# Patient Record
Sex: Male | Born: 1955 | Race: White | Hispanic: No | Marital: Married | Smoking: Former smoker
Health system: Southern US, Community
[De-identification: ages and names within clinical notes are randomized; demographics above are authoritative.]

## PROBLEM LIST (undated history)

## (undated) DIAGNOSIS — Z86018 Personal history of other benign neoplasm: Secondary | ICD-10-CM

## (undated) DIAGNOSIS — I10 Essential (primary) hypertension: Secondary | ICD-10-CM

## (undated) DIAGNOSIS — E7800 Pure hypercholesterolemia, unspecified: Secondary | ICD-10-CM

## (undated) DIAGNOSIS — Z85828 Personal history of other malignant neoplasm of skin: Secondary | ICD-10-CM

## (undated) DIAGNOSIS — Z86006 Personal history of melanoma in-situ: Secondary | ICD-10-CM

## (undated) HISTORY — PX: COLONOSCOPY: SHX174

## (undated) HISTORY — DX: Personal history of other benign neoplasm: Z86.018

## (undated) HISTORY — DX: Personal history of melanoma in-situ: Z86.006

## (undated) HISTORY — DX: Personal history of other malignant neoplasm of skin: Z85.828

## (undated) HISTORY — PX: FRACTURE SURGERY: SHX138

---

## 2005-12-30 ENCOUNTER — Ambulatory Visit: Payer: Self-pay | Admitting: Gastroenterology

## 2006-04-18 ENCOUNTER — Ambulatory Visit: Payer: Self-pay | Admitting: Unknown Physician Specialty

## 2006-04-20 ENCOUNTER — Ambulatory Visit: Payer: Self-pay | Admitting: Unknown Physician Specialty

## 2011-05-05 ENCOUNTER — Ambulatory Visit: Payer: Self-pay | Admitting: Unknown Physician Specialty

## 2011-06-22 DIAGNOSIS — Z86006 Personal history of melanoma in-situ: Secondary | ICD-10-CM

## 2011-06-22 HISTORY — DX: Personal history of melanoma in-situ: Z86.006

## 2011-07-04 ENCOUNTER — Ambulatory Visit: Payer: Self-pay | Admitting: Internal Medicine

## 2011-07-04 IMAGING — CR DG CHEST 2V
1 series · 3 of 3 positions shown · non-contrast
Comparison: none

REASON FOR EXAM: melanoma on lower back
COMMENTS:

PROCEDURE:     DXR - DXR CHEST PA (OR AP) AND LATERAL  - [DATE]  [DATE]
RESULT:     Comparison: None.

[Series 1: w chest pa · 0.14mm/px · 3 of 3 slices shown]
[im 1/3]
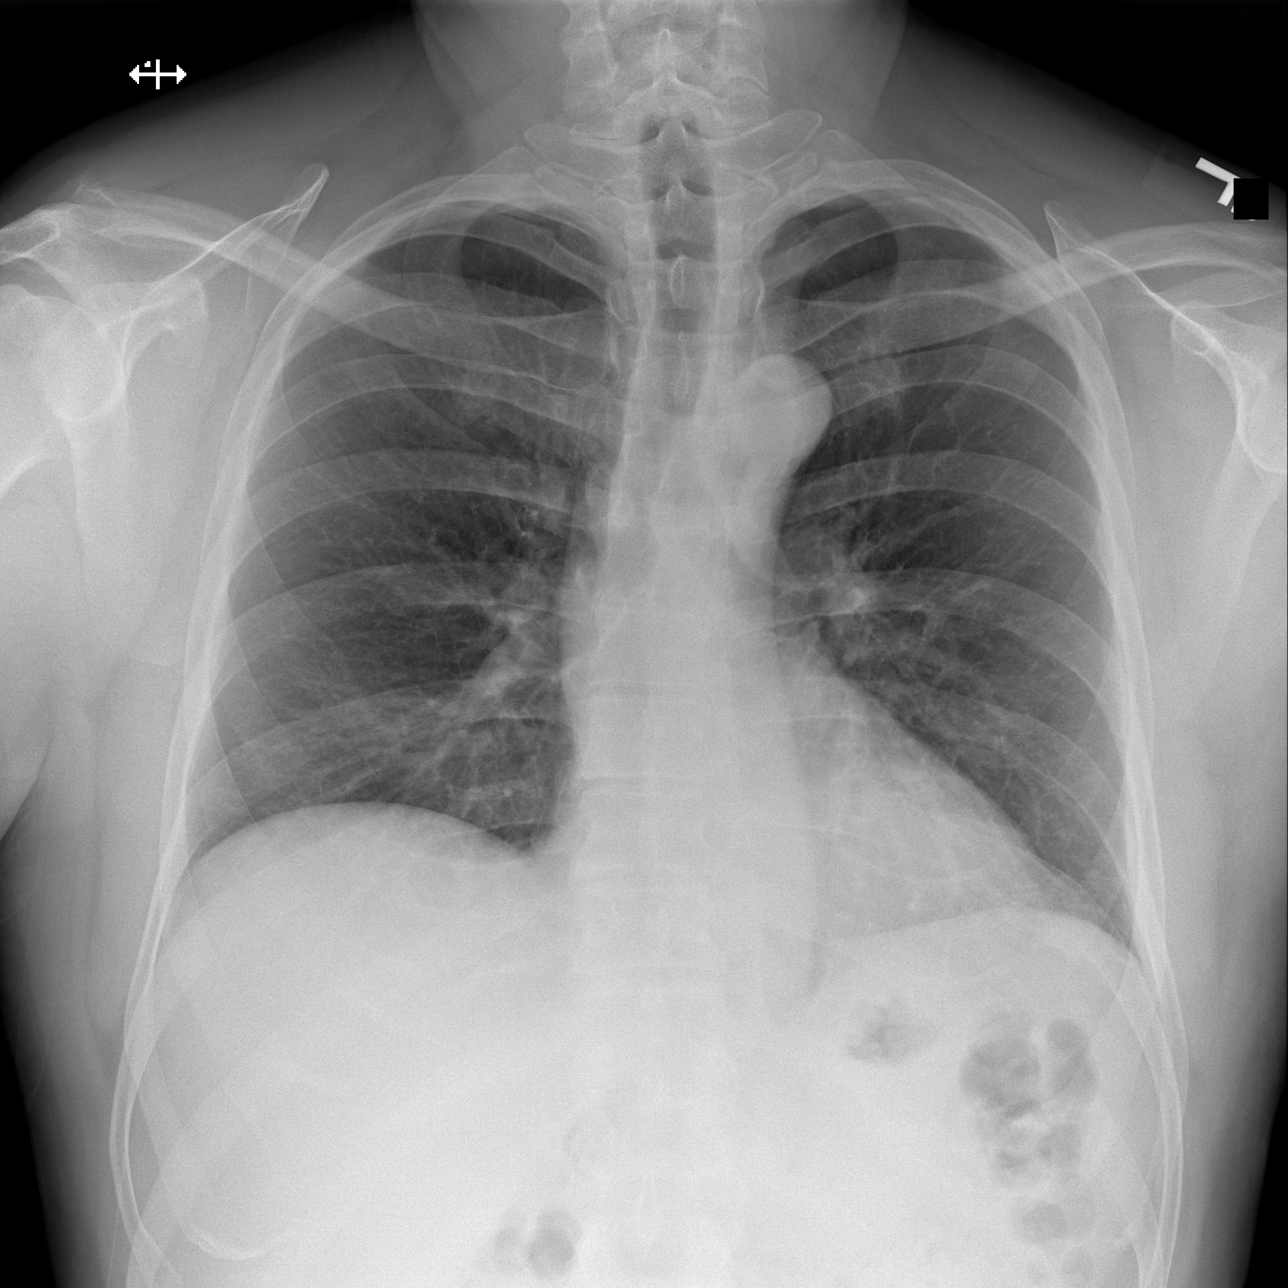
[im 2/3]
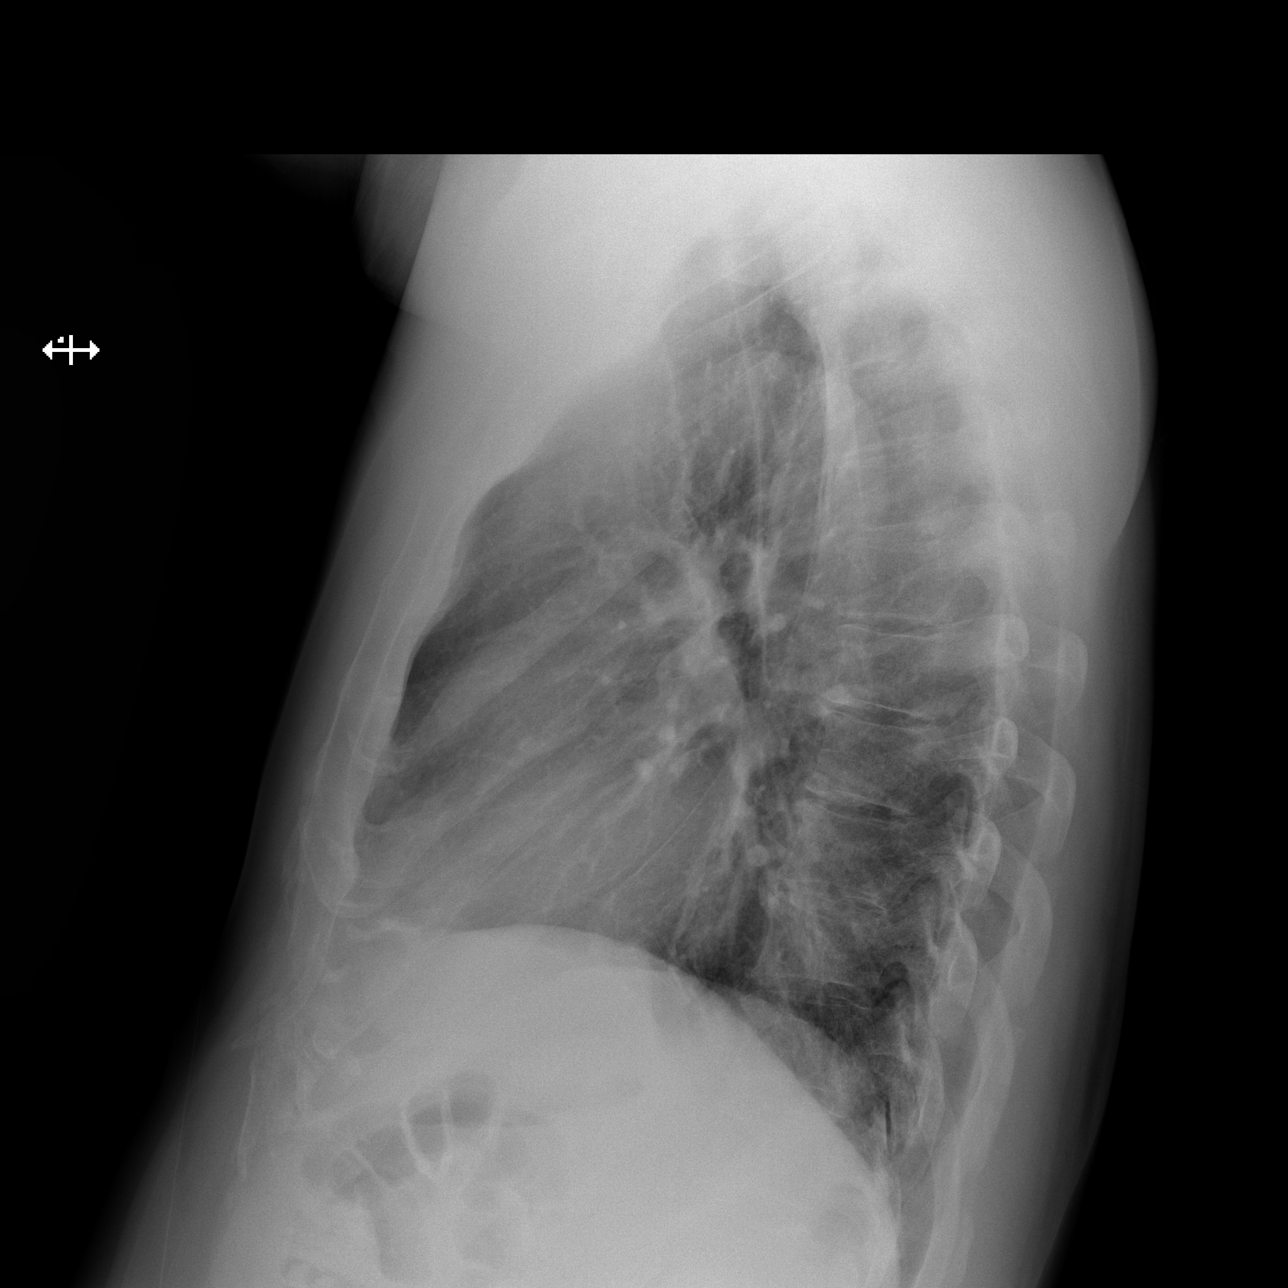
[im 3/3]
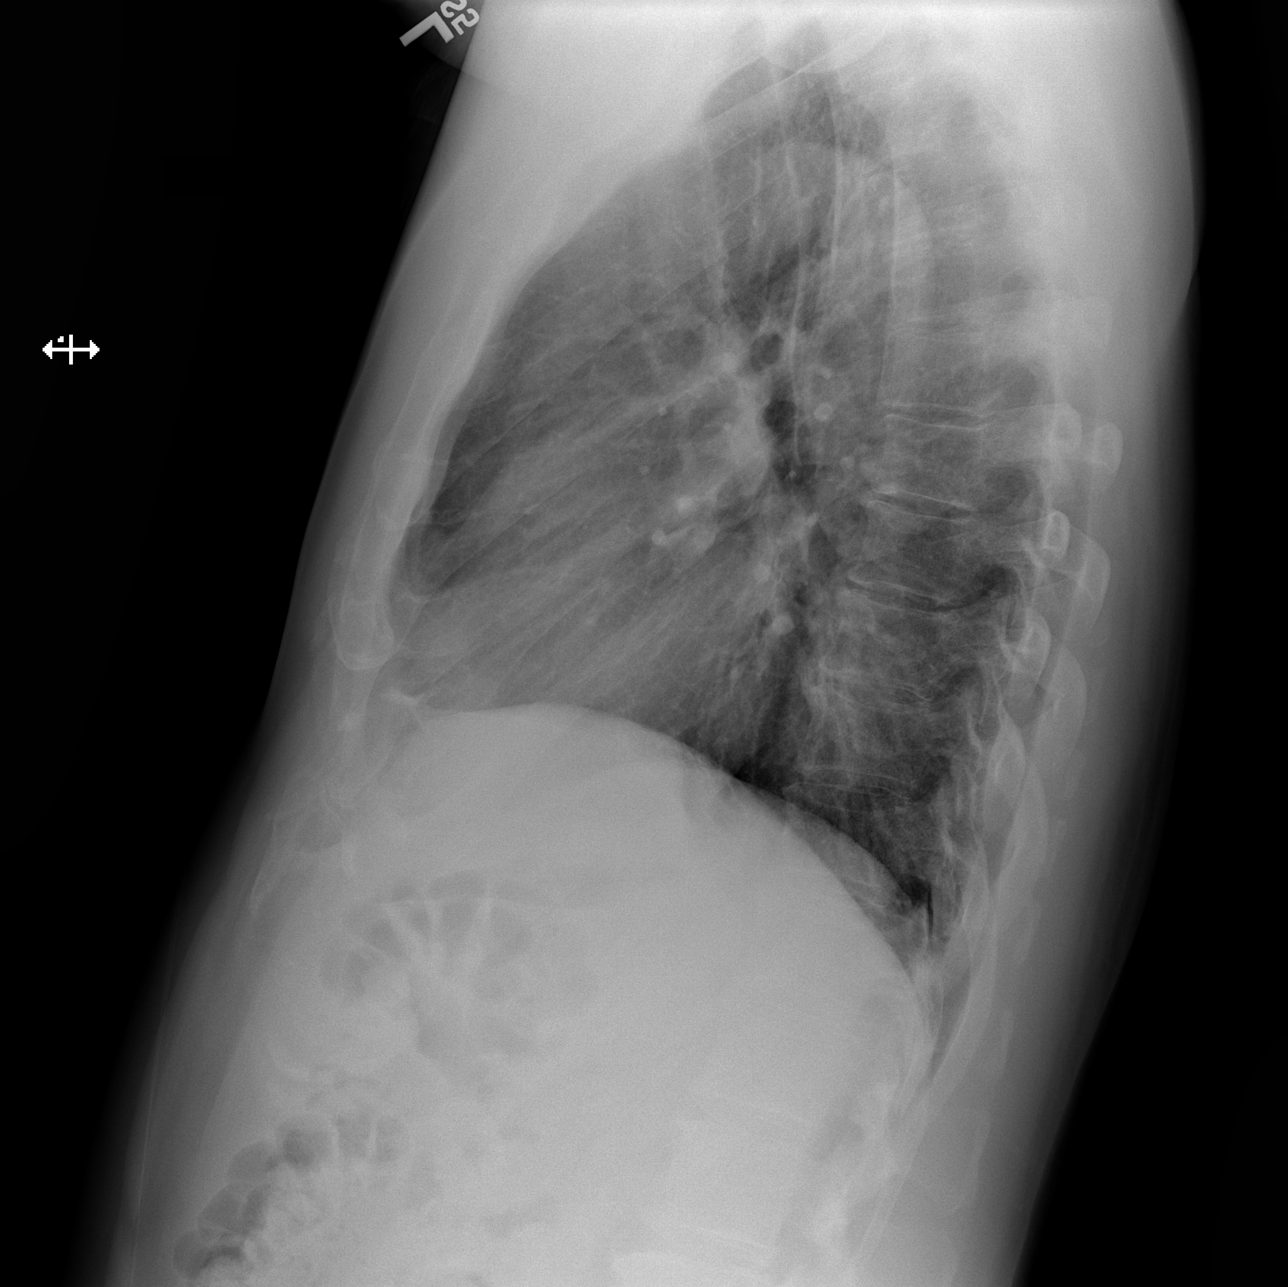

[3 of 3 positions shown; findings below may reference images not displayed]

FINDINGS: Heart is normal in size. Aorta mildly tortuous. No focal pulmonary opacities.
IMPRESSION: No acute cardiopulmonary disease.

## 2014-07-10 DIAGNOSIS — Z85828 Personal history of other malignant neoplasm of skin: Secondary | ICD-10-CM

## 2014-07-10 HISTORY — DX: Personal history of other malignant neoplasm of skin: Z85.828

## 2014-08-26 ENCOUNTER — Ambulatory Visit: Payer: Self-pay | Admitting: Family Medicine

## 2014-08-26 IMAGING — US US GALLBLADDER-BILIARY (RUQ)
1 series · 14 of 25 positions shown · non-contrast
Comparison: Ultrasound [DATE]  only report available.

CLINICAL DATA: Elevated liver enzymes

EXAM:
US ABDOMEN LIMITED - RIGHT UPPER QUADRANT

[Series 1: us gallbladder-biliary (ruq) · 0.26mm/px · 14 of 46 slices shown]
[im 1/46]
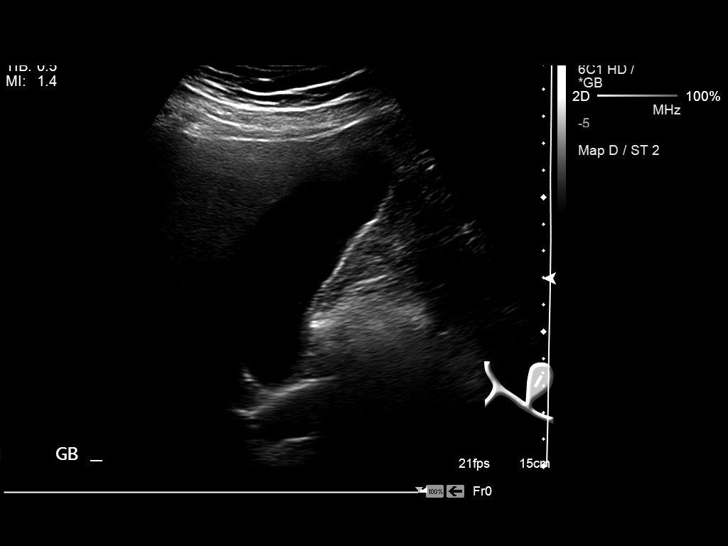
[im 4/46]
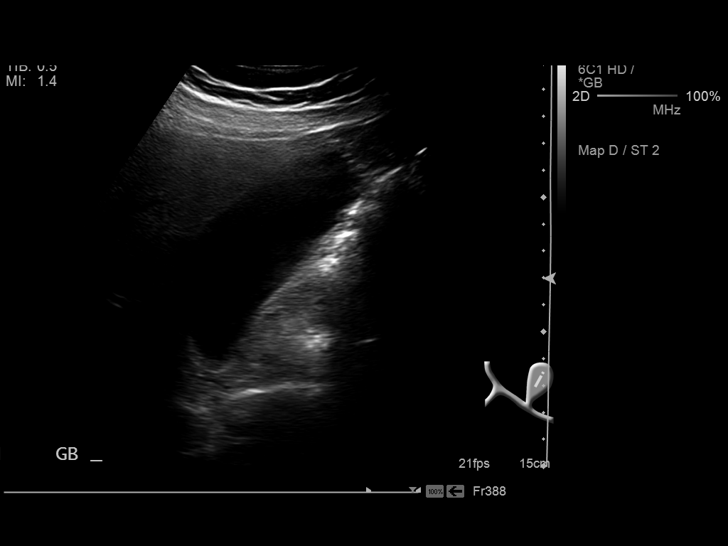
[im 8/46]
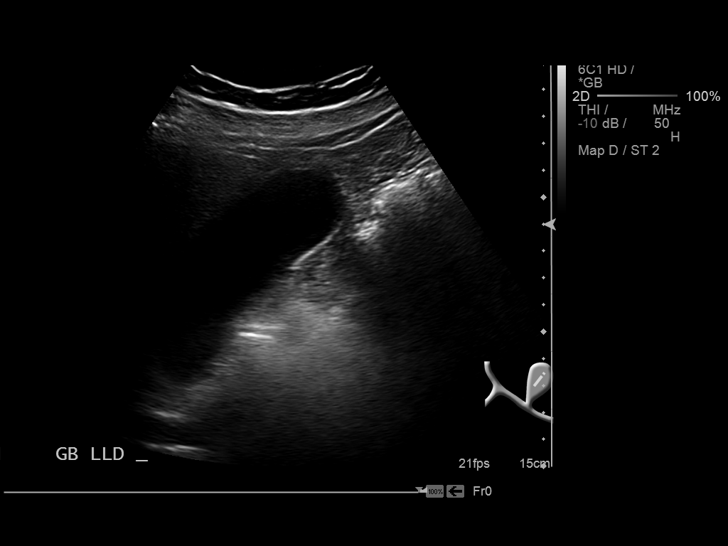
[im 12/46]
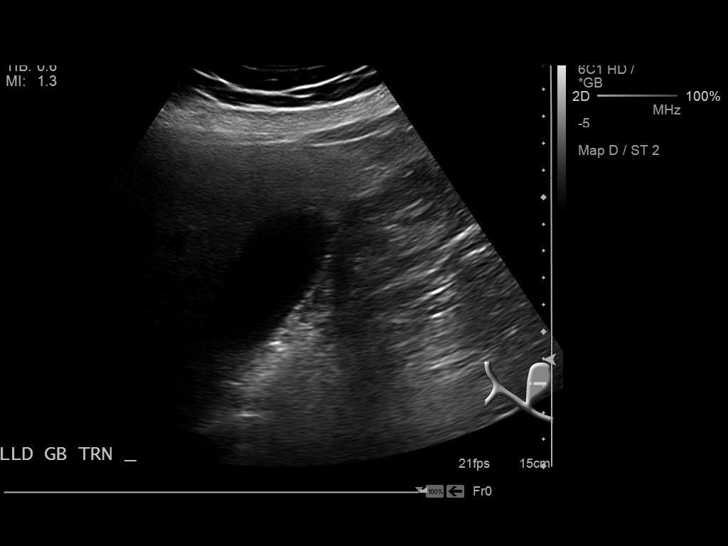
[im 16/46]
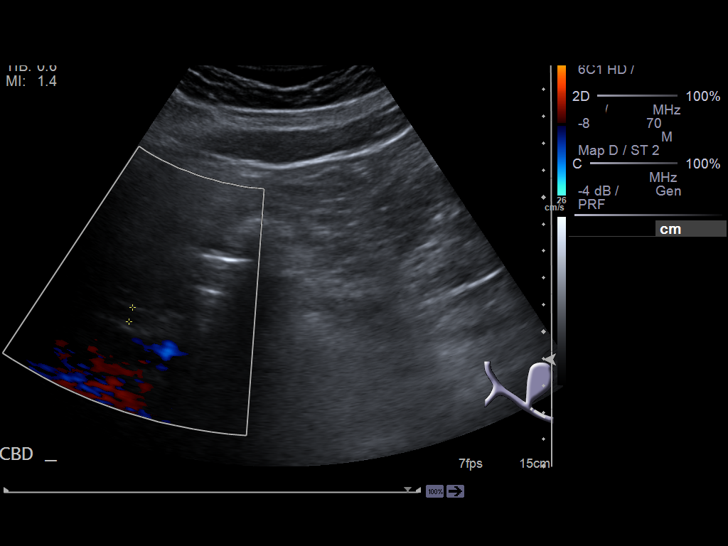
[im 17/46]
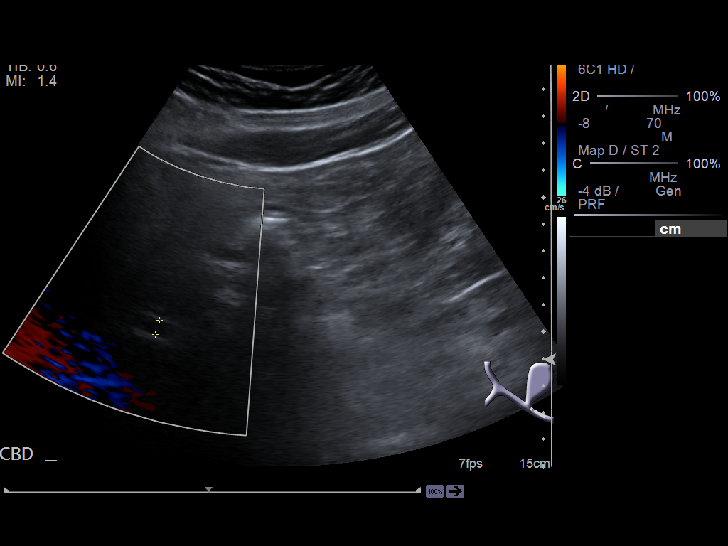
[im 21/46]
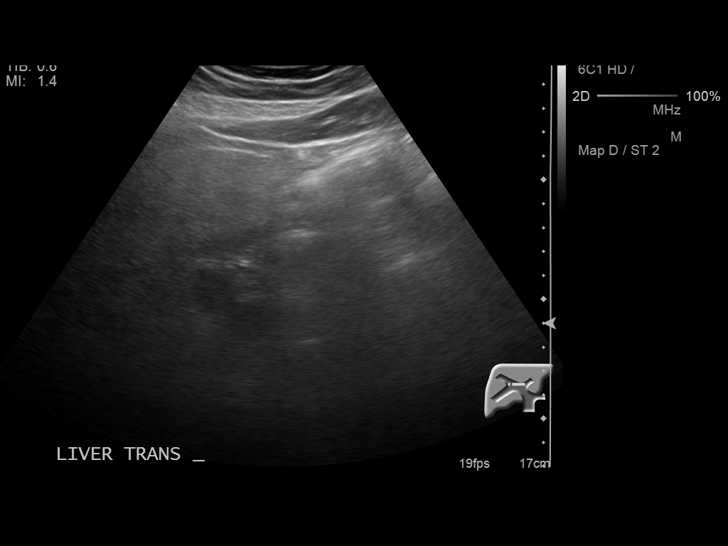
[im 25/46]
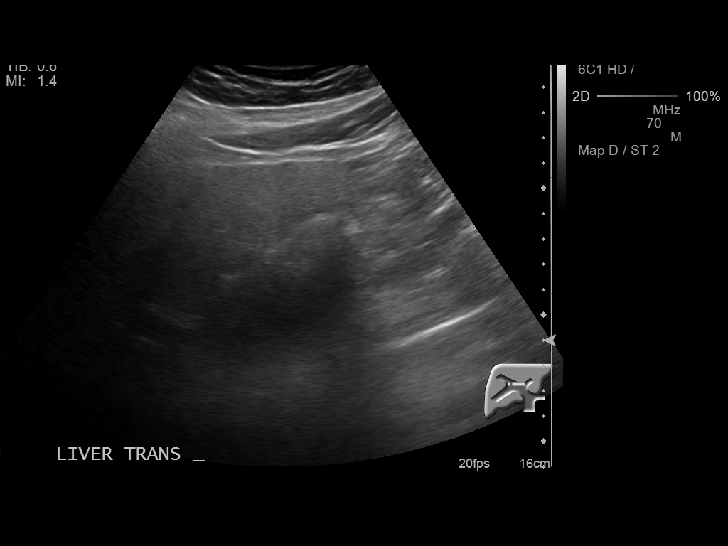
[im 29/46]
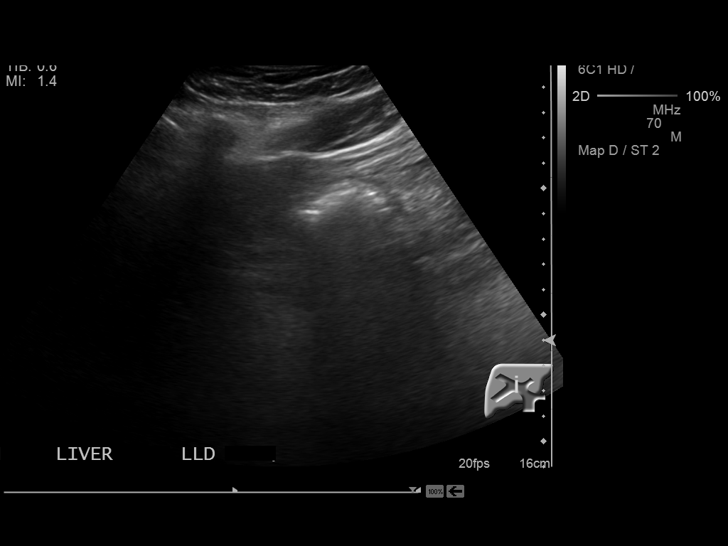
[im 31/46]
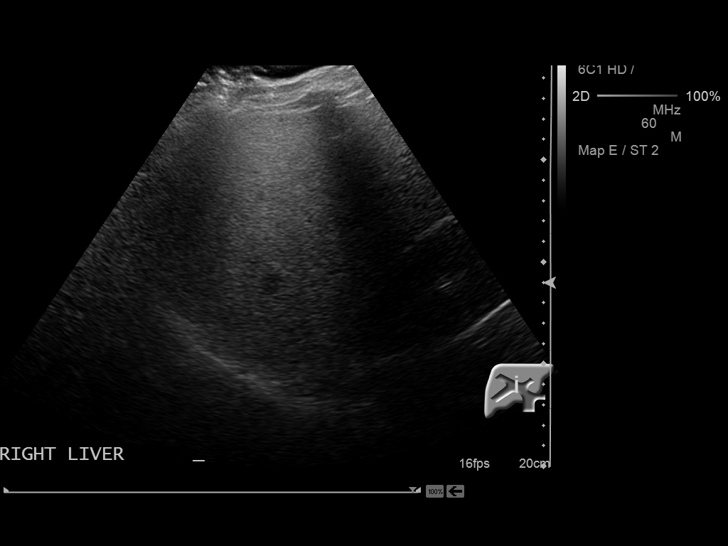
[im 34/46]
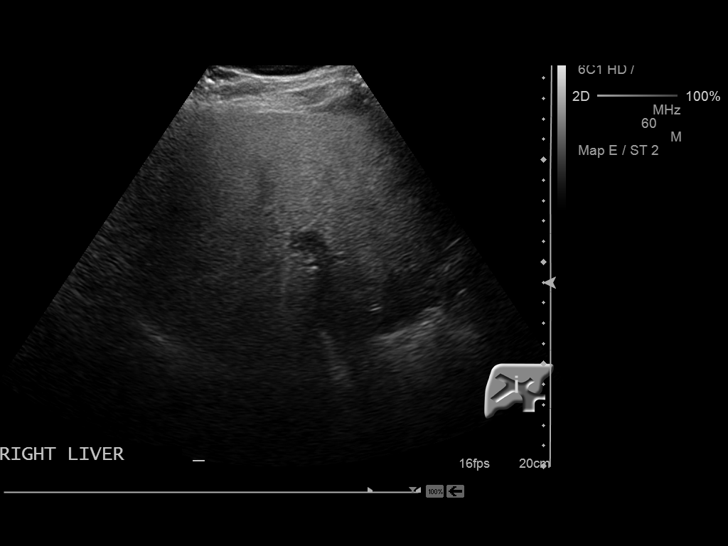
[im 38/46]
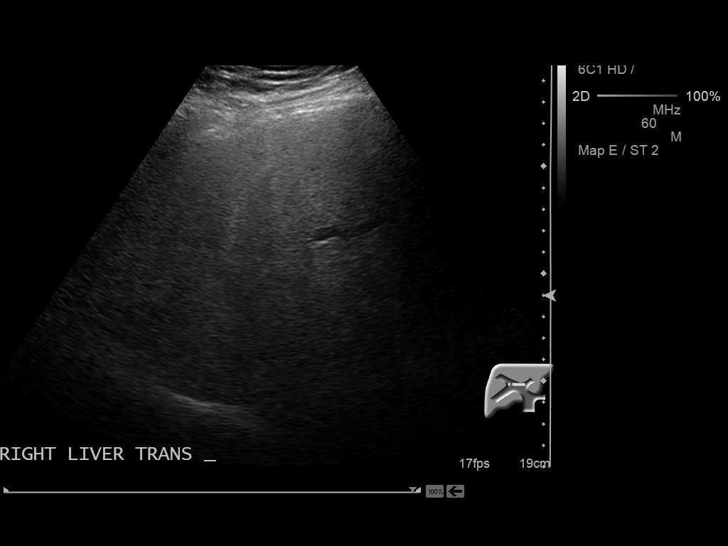
[im 42/46]
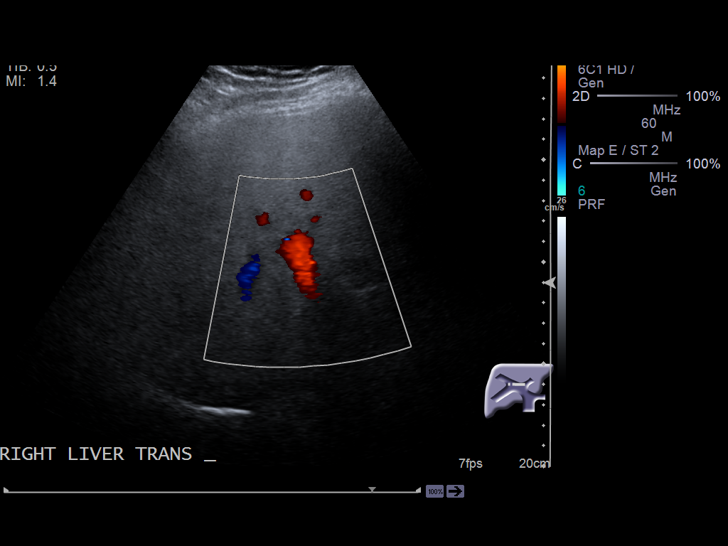
[im 46/46]
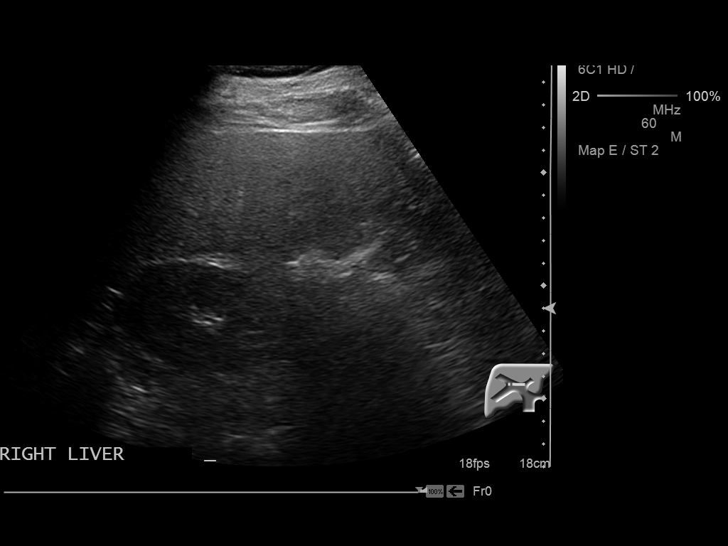

[14 of 25 positions shown; findings below may reference images not displayed]

FINDINGS: Gallbladder:

No gallstones or wall thickening visualized. No sonographic Murphy
sign noted.

Common bile duct:

Diameter: 5.5 mm in diameter within normal limits.

Liver:

No focal lesion identified. Diffuse increased echogenicity of the
liver suspicious for fatty infiltration.
IMPRESSION: 1. No gallstones are noted within gallbladder.
2. CBD measures 5.5 mm in diameter within normal limits.
3. Diffuse increased echogenicity of the liver suspicious for fatty
infiltration.

## 2015-03-03 DIAGNOSIS — E7800 Pure hypercholesterolemia, unspecified: Secondary | ICD-10-CM | POA: Insufficient documentation

## 2015-03-03 DIAGNOSIS — I10 Essential (primary) hypertension: Secondary | ICD-10-CM | POA: Insufficient documentation

## 2016-01-27 DIAGNOSIS — Z86018 Personal history of other benign neoplasm: Secondary | ICD-10-CM

## 2016-01-27 HISTORY — DX: Personal history of other benign neoplasm: Z86.018

## 2016-08-18 DIAGNOSIS — Z79899 Other long term (current) drug therapy: Secondary | ICD-10-CM | POA: Diagnosis not present

## 2016-08-25 DIAGNOSIS — E7800 Pure hypercholesterolemia, unspecified: Secondary | ICD-10-CM | POA: Diagnosis not present

## 2016-08-25 DIAGNOSIS — R739 Hyperglycemia, unspecified: Secondary | ICD-10-CM | POA: Diagnosis not present

## 2016-08-25 DIAGNOSIS — I10 Essential (primary) hypertension: Secondary | ICD-10-CM | POA: Diagnosis not present

## 2016-08-31 DIAGNOSIS — L570 Actinic keratosis: Secondary | ICD-10-CM | POA: Diagnosis not present

## 2016-08-31 DIAGNOSIS — Z1283 Encounter for screening for malignant neoplasm of skin: Secondary | ICD-10-CM | POA: Diagnosis not present

## 2016-08-31 DIAGNOSIS — Z85820 Personal history of malignant melanoma of skin: Secondary | ICD-10-CM | POA: Diagnosis not present

## 2016-08-31 DIAGNOSIS — Z85828 Personal history of other malignant neoplasm of skin: Secondary | ICD-10-CM | POA: Diagnosis not present

## 2017-02-21 DIAGNOSIS — Z79899 Other long term (current) drug therapy: Secondary | ICD-10-CM | POA: Diagnosis not present

## 2017-02-21 DIAGNOSIS — R739 Hyperglycemia, unspecified: Secondary | ICD-10-CM | POA: Diagnosis not present

## 2017-02-21 DIAGNOSIS — E7800 Pure hypercholesterolemia, unspecified: Secondary | ICD-10-CM | POA: Diagnosis not present

## 2017-02-28 DIAGNOSIS — E7800 Pure hypercholesterolemia, unspecified: Secondary | ICD-10-CM | POA: Diagnosis not present

## 2017-02-28 DIAGNOSIS — R739 Hyperglycemia, unspecified: Secondary | ICD-10-CM | POA: Diagnosis not present

## 2017-02-28 DIAGNOSIS — I10 Essential (primary) hypertension: Secondary | ICD-10-CM | POA: Diagnosis not present

## 2017-03-08 DIAGNOSIS — D485 Neoplasm of uncertain behavior of skin: Secondary | ICD-10-CM | POA: Diagnosis not present

## 2017-03-08 DIAGNOSIS — Z85820 Personal history of malignant melanoma of skin: Secondary | ICD-10-CM | POA: Diagnosis not present

## 2017-03-08 DIAGNOSIS — Z1283 Encounter for screening for malignant neoplasm of skin: Secondary | ICD-10-CM | POA: Diagnosis not present

## 2017-08-24 DIAGNOSIS — R739 Hyperglycemia, unspecified: Secondary | ICD-10-CM | POA: Diagnosis not present

## 2017-08-24 DIAGNOSIS — R972 Elevated prostate specific antigen [PSA]: Secondary | ICD-10-CM | POA: Diagnosis not present

## 2017-08-24 DIAGNOSIS — E7800 Pure hypercholesterolemia, unspecified: Secondary | ICD-10-CM | POA: Diagnosis not present

## 2017-08-24 DIAGNOSIS — Z79899 Other long term (current) drug therapy: Secondary | ICD-10-CM | POA: Diagnosis not present

## 2017-08-31 DIAGNOSIS — Z0000 Encounter for general adult medical examination without abnormal findings: Secondary | ICD-10-CM | POA: Diagnosis not present

## 2017-08-31 DIAGNOSIS — Z23 Encounter for immunization: Secondary | ICD-10-CM | POA: Diagnosis not present

## 2017-08-31 DIAGNOSIS — E7800 Pure hypercholesterolemia, unspecified: Secondary | ICD-10-CM | POA: Diagnosis not present

## 2017-08-31 DIAGNOSIS — I10 Essential (primary) hypertension: Secondary | ICD-10-CM | POA: Diagnosis not present

## 2017-09-27 DIAGNOSIS — Z85820 Personal history of malignant melanoma of skin: Secondary | ICD-10-CM | POA: Diagnosis not present

## 2017-09-27 DIAGNOSIS — L820 Inflamed seborrheic keratosis: Secondary | ICD-10-CM | POA: Diagnosis not present

## 2017-09-27 DIAGNOSIS — D485 Neoplasm of uncertain behavior of skin: Secondary | ICD-10-CM | POA: Diagnosis not present

## 2017-09-27 DIAGNOSIS — Z1283 Encounter for screening for malignant neoplasm of skin: Secondary | ICD-10-CM | POA: Diagnosis not present

## 2018-03-01 DIAGNOSIS — E7800 Pure hypercholesterolemia, unspecified: Secondary | ICD-10-CM | POA: Diagnosis not present

## 2018-03-01 DIAGNOSIS — Z79899 Other long term (current) drug therapy: Secondary | ICD-10-CM | POA: Diagnosis not present

## 2018-03-01 DIAGNOSIS — R739 Hyperglycemia, unspecified: Secondary | ICD-10-CM | POA: Diagnosis not present

## 2018-03-08 DIAGNOSIS — I10 Essential (primary) hypertension: Secondary | ICD-10-CM | POA: Diagnosis not present

## 2018-03-08 DIAGNOSIS — E7800 Pure hypercholesterolemia, unspecified: Secondary | ICD-10-CM | POA: Diagnosis not present

## 2018-03-08 DIAGNOSIS — R739 Hyperglycemia, unspecified: Secondary | ICD-10-CM | POA: Diagnosis not present

## 2018-04-04 DIAGNOSIS — L508 Other urticaria: Secondary | ICD-10-CM | POA: Diagnosis not present

## 2018-04-04 DIAGNOSIS — D485 Neoplasm of uncertain behavior of skin: Secondary | ICD-10-CM | POA: Diagnosis not present

## 2018-04-04 DIAGNOSIS — Z1283 Encounter for screening for malignant neoplasm of skin: Secondary | ICD-10-CM | POA: Diagnosis not present

## 2018-04-04 DIAGNOSIS — L578 Other skin changes due to chronic exposure to nonionizing radiation: Secondary | ICD-10-CM | POA: Diagnosis not present

## 2018-06-18 DIAGNOSIS — J0100 Acute maxillary sinusitis, unspecified: Secondary | ICD-10-CM | POA: Diagnosis not present

## 2018-06-28 DIAGNOSIS — E7800 Pure hypercholesterolemia, unspecified: Secondary | ICD-10-CM | POA: Diagnosis not present

## 2018-06-28 DIAGNOSIS — I10 Essential (primary) hypertension: Secondary | ICD-10-CM | POA: Diagnosis not present

## 2018-06-28 DIAGNOSIS — R739 Hyperglycemia, unspecified: Secondary | ICD-10-CM | POA: Diagnosis not present

## 2018-08-30 DIAGNOSIS — Z79899 Other long term (current) drug therapy: Secondary | ICD-10-CM | POA: Diagnosis not present

## 2018-08-30 DIAGNOSIS — Z125 Encounter for screening for malignant neoplasm of prostate: Secondary | ICD-10-CM | POA: Diagnosis not present

## 2018-08-30 DIAGNOSIS — R739 Hyperglycemia, unspecified: Secondary | ICD-10-CM | POA: Diagnosis not present

## 2018-08-30 DIAGNOSIS — E7800 Pure hypercholesterolemia, unspecified: Secondary | ICD-10-CM | POA: Diagnosis not present

## 2018-09-06 DIAGNOSIS — I10 Essential (primary) hypertension: Secondary | ICD-10-CM | POA: Diagnosis not present

## 2018-09-06 DIAGNOSIS — E7800 Pure hypercholesterolemia, unspecified: Secondary | ICD-10-CM | POA: Diagnosis not present

## 2018-09-06 DIAGNOSIS — Z0000 Encounter for general adult medical examination without abnormal findings: Secondary | ICD-10-CM | POA: Diagnosis not present

## 2018-09-20 DIAGNOSIS — I10 Essential (primary) hypertension: Secondary | ICD-10-CM | POA: Diagnosis not present

## 2019-02-28 ENCOUNTER — Other Ambulatory Visit: Payer: Self-pay

## 2019-02-28 DIAGNOSIS — Z20822 Contact with and (suspected) exposure to covid-19: Secondary | ICD-10-CM

## 2019-03-01 LAB — NOVEL CORONAVIRUS, NAA: SARS-CoV-2, NAA: NOT DETECTED

## 2019-05-21 ENCOUNTER — Other Ambulatory Visit: Payer: Self-pay

## 2019-05-21 DIAGNOSIS — Z20822 Contact with and (suspected) exposure to covid-19: Secondary | ICD-10-CM

## 2019-05-23 LAB — NOVEL CORONAVIRUS, NAA: SARS-CoV-2, NAA: NOT DETECTED

## 2019-11-27 ENCOUNTER — Other Ambulatory Visit: Payer: Self-pay

## 2019-11-27 ENCOUNTER — Ambulatory Visit: Payer: BC Managed Care – PPO | Admitting: Dermatology

## 2019-11-27 DIAGNOSIS — L578 Other skin changes due to chronic exposure to nonionizing radiation: Secondary | ICD-10-CM

## 2019-11-27 DIAGNOSIS — Z1283 Encounter for screening for malignant neoplasm of skin: Secondary | ICD-10-CM

## 2019-11-27 DIAGNOSIS — Z86006 Personal history of melanoma in-situ: Secondary | ICD-10-CM

## 2019-11-27 DIAGNOSIS — D1801 Hemangioma of skin and subcutaneous tissue: Secondary | ICD-10-CM

## 2019-11-27 DIAGNOSIS — L918 Other hypertrophic disorders of the skin: Secondary | ICD-10-CM | POA: Diagnosis not present

## 2019-11-27 DIAGNOSIS — L814 Other melanin hyperpigmentation: Secondary | ICD-10-CM

## 2019-11-27 DIAGNOSIS — L820 Inflamed seborrheic keratosis: Secondary | ICD-10-CM | POA: Diagnosis not present

## 2019-11-27 DIAGNOSIS — D229 Melanocytic nevi, unspecified: Secondary | ICD-10-CM

## 2019-11-27 DIAGNOSIS — L821 Other seborrheic keratosis: Secondary | ICD-10-CM

## 2019-11-27 DIAGNOSIS — Z85828 Personal history of other malignant neoplasm of skin: Secondary | ICD-10-CM

## 2019-11-27 DIAGNOSIS — Z86018 Personal history of other benign neoplasm: Secondary | ICD-10-CM

## 2019-11-27 NOTE — Progress Notes (Signed)
Follow-Up Visit   Subjective  James Coleman is a 64 y.o. 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 The patient presents for Total-Body Skin Exam (TBSE) for skin cancer screening and mole check.    The following portions of the chart were reviewed this encounter and updated as appropriate:  Allergies  Meds  Problems  Med Hx  Surg Hx  Fam Hx      Review of Systems:  No other skin or systemic complaints except as noted in HPI or Assessment and Plan.  Objective  Well appearing patient in no apparent distress; mood and affect are within normal limits.  A full examination was performed including scalp, head, eyes, ears, nose, lips, neck, chest, axillae, abdomen, back, buttocks, bilateral upper extremities, bilateral lower extremities, hands, feet, fingers, toes, fingernails, and toenails. All findings within normal limits unless otherwise noted below.  Objective  Left ant neck, Neck, back (20): Erythematous keratotic or waxy stuck-on papule or plaque.   Objective  Bilateral axilla, Left Medial Thigh: Fleshy, skin-colored pedunculated papules.    Objective  Right mid back: Well healed excision site  Objective  Chin, Right Medial Canthus: Well healed scar with no evidence of recurrence.   Objective  Left mid back: Scar with no evidence of recurrence.    Assessment & Plan    Lentigines - Scattered tan macules - Discussed due to sun exposure - Benign, observe - Call for any changes  Seborrheic Keratoses - Stuck-on, waxy, tan-brown papules and plaques  - Discussed benign etiology and prognosis. - Observe - Call for any changes  Melanocytic Nevi - Tan-brown and/or pink-flesh-colored symmetric macules and papules - Benign appearing on exam today - Observation - Call clinic for new or changing moles -  Recommend daily use of broad spectrum spf 30+ sunscreen to sun-exposed areas.   Hemangiomas - Red papules - Discussed benign nature - Observe - Call for any changes  Actinic Damage - diffuse scaly erythematous macules with underlying dyspigmentation - Recommend daily broad spectrum sunscreen SPF 30+ to sun-exposed areas, reapply every 2 hours as needed.  - Call for new or changing lesions.  Skin cancer screening performed today.   Inflamed seborrheic keratosis (21) Left ant neck; Neck, back (20)  Epidermal / dermal shaving - Left ant neck 0.5 cm Informed consent: discussed and consent obtained   Timeout: patient name, date of birth, surgical site, and procedure verified   Procedure prep:  Patient was prepped and draped in usual sterile fashion Prep type:  Isopropyl alcohol Anesthesia: the lesion was anesthetized in a standard fashion   Anesthetic:  1% lidocaine w/ epinephrine 1-100,000 buffered w/ 8.4% NaHCO3 Instrument used: flexible razor blade   Hemostasis achieved with: pressure, aluminum chloride and electrodesiccation   Outcome: patient tolerated procedure well   Post-procedure details: sterile dressing applied and wound care instructions given   Dressing type: bandage and petrolatum    Destruction of lesion - Neck, back Complexity: simple   Destruction method: cryotherapy   Informed consent: discussed and consent obtained   Timeout:  patient name, date of birth, surgical site, and procedure verified Lesion destroyed using liquid nitrogen: Yes   Region frozen until ice ball extended beyond lesion: Yes   Outcome: patient tolerated procedure well with no complications   Post-procedure details: wound care instructions given  Skin tag (2) Bilateral axilla; Left Medial Thigh  Will plan snip excision to bilateral axilla on follow up.  Epidermal / dermal shaving - Left Medial Thigh  Informed consent: discussed and consent obtained   Patient was prepped and draped  in usual sterile fashion: Area prepped with alcohol. Anesthesia: the lesion was anesthetized in a standard fashion   Anesthetic:  1% lidocaine w/ epinephrine 1-100,000 buffered w/ 8.4% NaHCO3 Instrument used: scissors   Hemostasis achieved with: pressure, aluminum chloride and electrodesiccation   Outcome: patient tolerated procedure well   Post-procedure details: wound care instructions given    History of melanoma in situ Right mid back  Clear today. Observe   History of basal cell carcinoma (BCC) (2) Right Medial Canthus; Chin  Clear today. Observe   History of dysplastic nevus Left mid back  Clear today. Observe   Return in about 6 months (around 05/28/2020).  I, Ashok Cordia, CMA, am acting as scribe for Sarina Ser, MD .   I, Ashok Cordia, CMA, am acting as scribe for Sarina Ser, MD .  Documentation: I have reviewed the above documentation for accuracy and completeness, and I agree with the above.  Sarina Ser, MD

## 2019-11-27 NOTE — Patient Instructions (Signed)

## 2019-12-02 ENCOUNTER — Encounter: Payer: Self-pay | Admitting: Dermatology

## 2020-05-27 ENCOUNTER — Other Ambulatory Visit: Payer: Self-pay | Admitting: Urology

## 2020-05-27 DIAGNOSIS — C61 Malignant neoplasm of prostate: Secondary | ICD-10-CM

## 2020-05-28 ENCOUNTER — Other Ambulatory Visit: Payer: Self-pay

## 2020-05-28 ENCOUNTER — Ambulatory Visit: Payer: BC Managed Care – PPO | Admitting: Dermatology

## 2020-05-28 ENCOUNTER — Encounter: Payer: Self-pay | Admitting: Dermatology

## 2020-05-28 DIAGNOSIS — L820 Inflamed seborrheic keratosis: Secondary | ICD-10-CM

## 2020-05-28 DIAGNOSIS — Z85828 Personal history of other malignant neoplasm of skin: Secondary | ICD-10-CM

## 2020-05-28 DIAGNOSIS — Z85820 Personal history of malignant melanoma of skin: Secondary | ICD-10-CM | POA: Diagnosis not present

## 2020-05-28 DIAGNOSIS — L821 Other seborrheic keratosis: Secondary | ICD-10-CM

## 2020-05-28 DIAGNOSIS — Z86018 Personal history of other benign neoplasm: Secondary | ICD-10-CM | POA: Diagnosis not present

## 2020-05-28 DIAGNOSIS — L578 Other skin changes due to chronic exposure to nonionizing radiation: Secondary | ICD-10-CM

## 2020-05-28 DIAGNOSIS — Z1283 Encounter for screening for malignant neoplasm of skin: Secondary | ICD-10-CM

## 2020-05-28 DIAGNOSIS — L918 Other hypertrophic disorders of the skin: Secondary | ICD-10-CM

## 2020-05-28 DIAGNOSIS — D1800 Hemangioma unspecified site: Secondary | ICD-10-CM

## 2020-05-28 DIAGNOSIS — D229 Melanocytic nevi, unspecified: Secondary | ICD-10-CM

## 2020-05-28 DIAGNOSIS — L814 Other melanin hyperpigmentation: Secondary | ICD-10-CM

## 2020-05-28 NOTE — Progress Notes (Signed)
Follow-Up Visit   Subjective  James Coleman is a 64 y.o. male who presents for the following: Annual Exam (Mole check waist up, Hx of MM on the back ). Pt c/o irritated itchy skin tags and moles at the L axilla,  R axilla chest and back  pt would like removed today.  The patient presents for Upper Body Skin Exam (UBSE) for skin cancer screening and mole check.  The following portions of the chart were reviewed this encounter and updated as appropriate:   Allergies  Meds  Problems  Med Hx  Surg Hx  Fam Hx     Review of Systems:  No other skin or systemic complaints except as noted in HPI or Assessment and Plan.  Objective  Well appearing patient in no apparent distress; mood and affect are within normal limits.  All skin waist up examined.  Objective  Mid Back: Well healed scar with no evidence of recurrence, no lymphadenopathy.   Objective  Left back, Left chest (3): Erythematous keratotic or waxy stuck-on papule or plaque.   Objective  see history: Well healed scar with no evidence of recurrence.   Objective  see history: Scar with no evidence of recurrence.   Objective  R axilla, L axilla: Fleshy, skin-colored pedunculated papules.     Assessment & Plan  History of melanoma Mid Back Clear.  No lymphadenopathy.  Observe for recurrence. Call clinic for new or changing lesions.  Recommend regular skin exams, daily broad-spectrum spf 30+ sunscreen use, and photoprotection.     Inflamed seborrheic keratosis (3) Left back, Left chest  Destruction of lesion - Left back, Left chest Complexity: simple   Destruction method: cryotherapy   Informed consent: discussed and consent obtained   Timeout:  patient name, date of birth, surgical site, and procedure verified Lesion destroyed using liquid nitrogen: Yes   Region frozen until ice ball extended beyond lesion: Yes   Outcome: patient tolerated procedure well with no complications   Post-procedure details:  wound care instructions given    History of basal cell carcinoma (BCC) see history Clear. Observe for recurrence. Call clinic for new or changing lesions.  Recommend regular skin exams, daily broad-spectrum spf 30+ sunscreen use, and photoprotection.     History of dysplastic nevus see history Clear. Observe for recurrence. Call clinic for new or changing lesions.  Recommend regular skin exams, daily broad-spectrum spf 30+ sunscreen use, and photoprotection.  ;  Skin tag R axilla, L axilla Pay wii pay out of pocket $115 for today's procedure   Acrochordons (Skin Tags) - Removal desired by patient - Fleshy, skin-colored pedunculated papules - Benign appearing.  - Patient desires removal. Reviewed that this is not covered by insurance and they will be charged a cosmetic fee for removal. Patient signed non-covered consent.  - Prior to the procedure, reviewed the expected small wound. Also reviewed the risk of leaving a small scar and the small risk of infection.  PROCEDURE - The areas were prepped with isopropyl alcohol. A small amount of lidocaine 1% with epinephrine was injected at the base of each lesion to achieve good local anesthesia. The skin tags were removed using a snip technique. Aluminum chloride was used for hemostasis. Petrolatum and a bandage were applied. The procedure was tolerated well. - Wound care was reviewed with the patient. They were advised to call with any concerns. Total number of treated acrochordons 15  Lentigines - Scattered tan macules - Discussed due to sun exposure - Benign,  observe - Call for any changes  Seborrheic Keratoses - Stuck-on, waxy, tan-brown papules and plaques  - Discussed benign etiology and prognosis. - Observe - Call for any changes  Melanocytic Nevi - Tan-brown and/or pink-flesh-colored symmetric macules and papules - Benign appearing on exam today - Observation - Call clinic for new or changing moles - Recommend daily use of  broad spectrum spf 30+ sunscreen to sun-exposed areas.   Hemangiomas - Red papules - Discussed benign nature - Observe - Call for any changes  Actinic Damage - Chronic, secondary to cumulative UV/sun exposure - diffuse scaly erythematous macules with underlying dyspigmentation - Recommend daily broad spectrum sunscreen SPF 30+ to sun-exposed areas, reapply every 2 hours as needed.  - Call for new or changing lesions.  Skin cancer screening performed today.  Return in about 6 months (around 11/26/2020) for TBSE.  IMarye Round, CMA, am acting as scribe for Sarina Ser, MD .  Documentation: I have reviewed the above documentation for accuracy and completeness, and I agree with the above.  Sarina Ser, MD

## 2020-05-28 NOTE — Patient Instructions (Signed)
Cryotherapy Aftercare  . Wash gently with soap and water everyday.   . Apply Vaseline and Band-Aid daily until healed.  

## 2020-06-03 ENCOUNTER — Encounter: Payer: Self-pay | Admitting: Dermatology

## 2020-06-18 ENCOUNTER — Ambulatory Visit
Admission: RE | Admit: 2020-06-18 | Discharge: 2020-06-18 | Disposition: A | Discharge status: Home / Self Care | Payer: BC Managed Care – PPO | Source: Ambulatory Visit | Attending: Urology | Admitting: Urology

## 2020-06-18 ENCOUNTER — Other Ambulatory Visit: Payer: Self-pay

## 2020-06-18 DIAGNOSIS — C61 Malignant neoplasm of prostate: Secondary | ICD-10-CM

## 2020-06-18 IMAGING — MR MR PROSTATE WO/W CM
12 series · 48 of 48 positions shown · IV contrast (multihance)
Comparison: None.

CLINICAL DATA: Prostate carcinoma, Gleason score 3+3=6.

EXAM:
MR PROSTATE WITHOUT AND WITH CONTRAST
TECHNIQUE: Multiplanar multisequence MRI images were obtained of the pelvis
centered about the prostate. Pre and post contrast images were
obtained.
CONTRAST:  20mL MULTIHANCE GADOBENATE DIMEGLUMINE 529 MG/ML IV SOLN

[Series 4: T2 · coronal · 3.0mm · 0.56mm/px · 1 of 23 slices shown (1 of 3)]
[im 1/23]
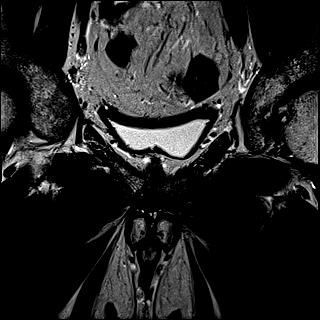

[Series 5: T1 · axial · 5.0mm · 1.25mm/px · 1 of 80 slices shown]
[im 1/80]
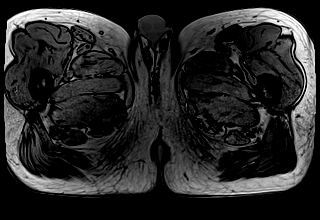

[Series 6: DWI · axial · 3.0mm · 1.75mm/px · z∈[-26,+31]mm · 2 of 60 slices shown (1 of 3)]
[im 1/60]
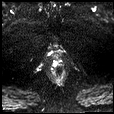
[im 60/60]
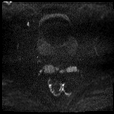

[Series 7: DWI · axial · 3.0mm · 1.75mm/px · 1 of 20 slices shown (2 of 3)]
[im 1/20]
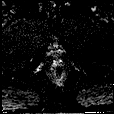

[Series 8: DWI · axial · 3.0mm · 1.75mm/px · 1 of 20 slices shown (3 of 3)]
[im 1/20]
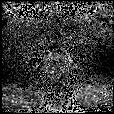

[Series 9: T2 · axial · 3.0mm · 0.56mm/px · 1 of 23 slices shown (2 of 3)]
[im 1/23]
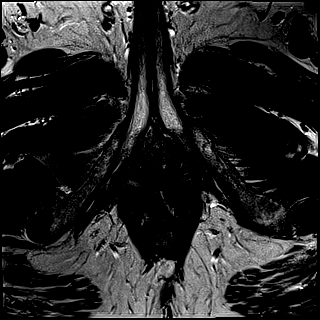

[Series 10: T2 · axial · 1.0mm · 1.04mm/px · z∈[-40,+39]mm · 2 of 80 slices shown (3 of 3)]
[im 1/80]
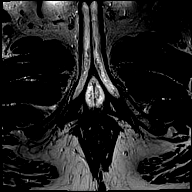
[im 80/80]
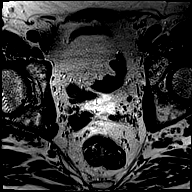

[Series 11: pre t1_twist_tra_dyn · axial · non-contrast · 3.5mm · 0.83mm/px · 1 of 20 slices shown]
[im 1/20]
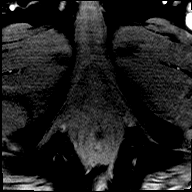

[Series 12: post t1_twist_tra_dyn-copy center · axial · non-contrast · 3.5mm · 0.83mm/px · z∈[-34,+33]mm · 17 of 600 slices shown]
[im 1/600]
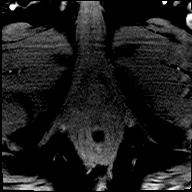
[im 38/600]
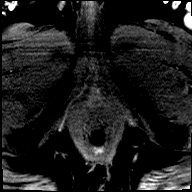
[im 75/600]
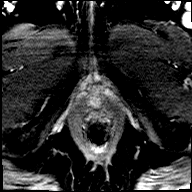
[im 113/600]
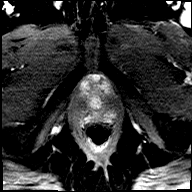
[im 150/600]
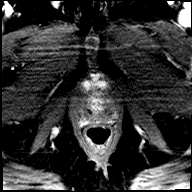
[im 188/600]
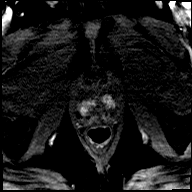
[im 225/600]
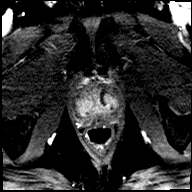
[im 263/600]
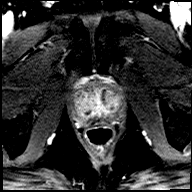
[im 300/600]
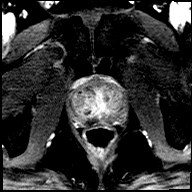
[im 337/600]
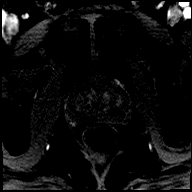
[im 375/600]
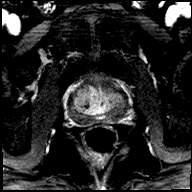
[im 412/600]
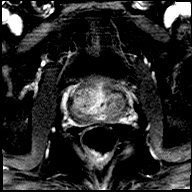
[im 450/600]
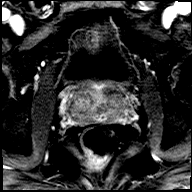
[im 487/600]
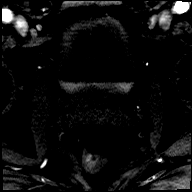
[im 525/600]
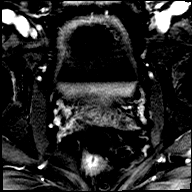
[im 562/600]
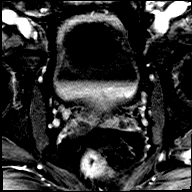
[im 600/600]
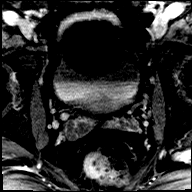

[Series 13: post t1_twist_tra_dyn-copy cent_sub · axial · 3.5mm · 0.83mm/px · z∈[-34,+33]mm · 17 of 579 slices shown]
[im 1/579]
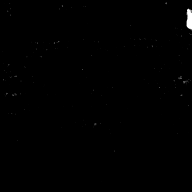
[im 37/579]
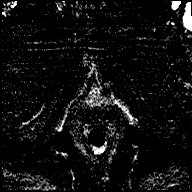
[im 73/579]
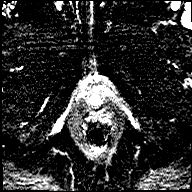
[im 109/579]
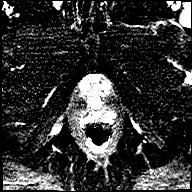
[im 145/579]
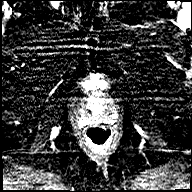
[im 181/579]
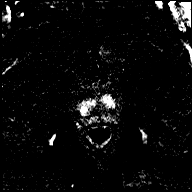
[im 217/579]
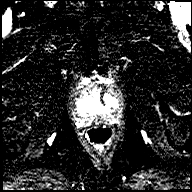
[im 253/579]
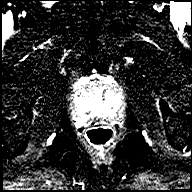
[im 290/579]
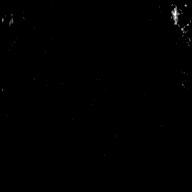
[im 326/579]
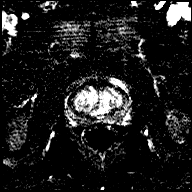
[im 362/579]
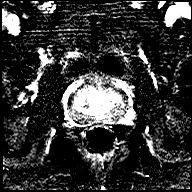
[im 398/579]
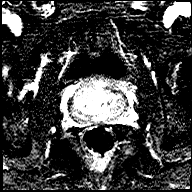
[im 434/579]
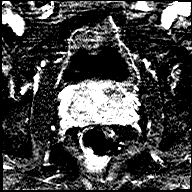
[im 470/579]
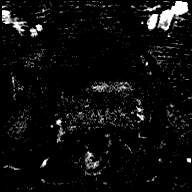
[im 506/579]
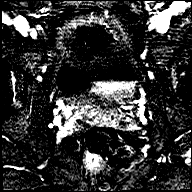
[im 542/579]
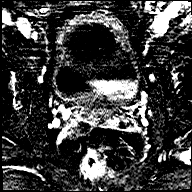
[im 579/579]
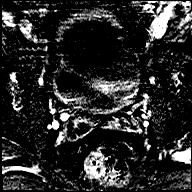

[Series 14: t1_vibe_dixon_tra_f · axial · 2.5mm · 0.91mm/px · z∈[-70,+127]mm · 2 of 80 slices shown]
[im 1/80]
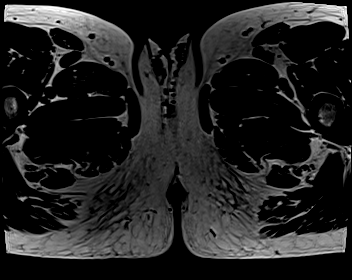
[im 80/80]
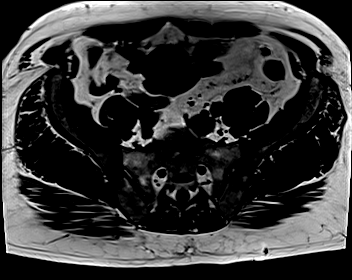

[Series 15: t1_vibe_dixon_tra_w · axial · 2.5mm · 0.91mm/px · z∈[-70,+127]mm · 2 of 80 slices shown]
[im 1/80]
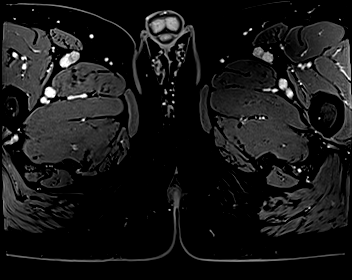
[im 80/80]
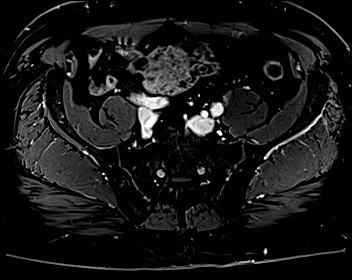

[48 of 48 positions shown; findings below may reference images not displayed]

FINDINGS: Prostate:

-- Peripheral Zone: Linear/wedge shaped hypointensities are noted on
ADC; however, no focal ADC hypointense or high b-value DWI
hyperintense nodules are identified.

-- Transition/Central Zone: Circumscribed BPH nodules are noted, but
no suspicious nodules with obscured or non-circumscribed margins
seen.

-- Measurements/Volume:  5.0 x 3.6 x 5.2 cm (volume = 49 cm^3)

Transcapsular spread:  Absent

Seminal vesicle involvement:  Absent

Neurovascular bundle involvement:  Absent

Pelvic adenopathy: None visualized

Bone metastasis: None visualized

Other:  Sigmoid diverticulosis, without evidence of diverticulitis.
IMPRESSION: No radiographic evidence of high-grade prostate carcinoma. PI-RADS
2: Low (clinically significant cancer is unlikely to be present)

## 2020-06-18 MED ORDER — GADOBENATE DIMEGLUMINE 529 MG/ML IV SOLN
20.0000 mL | Freq: Once | INTRAVENOUS | Status: AC | PRN
  Administered 2020-06-18: 20 mL via INTRAVENOUS

## 2020-11-23 ENCOUNTER — Emergency Department
Admission: EM | Admit: 2020-11-23 | Discharge: 2020-11-23 | Disposition: A | Discharge status: Home / Self Care | Payer: BC Managed Care – PPO | Attending: Emergency Medicine | Admitting: Emergency Medicine

## 2020-11-23 ENCOUNTER — Emergency Department: Payer: BC Managed Care – PPO

## 2020-11-23 ENCOUNTER — Encounter: Payer: Self-pay | Admitting: Emergency Medicine

## 2020-11-23 ENCOUNTER — Other Ambulatory Visit: Payer: Self-pay

## 2020-11-23 DIAGNOSIS — R1032 Left lower quadrant pain: Secondary | ICD-10-CM | POA: Insufficient documentation

## 2020-11-23 DIAGNOSIS — R109 Unspecified abdominal pain: Secondary | ICD-10-CM

## 2020-11-23 LAB — CBC
HCT: 44.2 % (ref 39.0–52.0)
Hemoglobin: 15.5 g/dL (ref 13.0–17.0)
MCH: 31.8 pg (ref 26.0–34.0)
MCHC: 35.1 g/dL (ref 30.0–36.0)
MCV: 90.6 fL (ref 80.0–100.0)
Platelets: 212 10*3/uL (ref 150–400)
RBC: 4.88 MIL/uL (ref 4.22–5.81)
RDW: 12.8 % (ref 11.5–15.5)
WBC: 6.8 10*3/uL (ref 4.0–10.5)
nRBC: 0 % (ref 0.0–0.2)

## 2020-11-23 LAB — COMPREHENSIVE METABOLIC PANEL WITH GFR
ALT: 41 U/L (ref 0–44)
AST: 26 U/L (ref 15–41)
Albumin: 4.6 g/dL (ref 3.5–5.0)
Alkaline Phosphatase: 51 U/L (ref 38–126)
Anion gap: 10 (ref 5–15)
BUN: 16 mg/dL (ref 8–23)
CO2: 24 mmol/L (ref 22–32)
Calcium: 9.9 mg/dL (ref 8.9–10.3)
Chloride: 103 mmol/L (ref 98–111)
Creatinine, Ser: 0.79 mg/dL (ref 0.61–1.24)
GFR, Estimated: >60 mL/min (ref >60)
Glucose, Bld: 120 mg/dL — ABNORMAL HIGH (ref 70–99)
Potassium: 3.7 mmol/L (ref 3.5–5.1)
Sodium: 137 mmol/L (ref 135–145)
Total Bilirubin: 0.8 mg/dL (ref 0.3–1.2)
Total Protein: 7.6 g/dL (ref 6.5–8.1)

## 2020-11-23 LAB — URINALYSIS, COMPLETE (UACMP) WITH MICROSCOPIC
Bacteria, UA: NONE SEEN
Bilirubin Urine: NEGATIVE
Glucose, UA: NEGATIVE mg/dL
Ketones, ur: NEGATIVE mg/dL
Leukocytes,Ua: NEGATIVE
Nitrite: NEGATIVE
Protein, ur: NEGATIVE mg/dL
Specific Gravity, Urine: 1.009 (ref 1.005–1.030)
Squamous Epithelial / HPF: NONE SEEN (ref 0–5)
pH: 5 (ref 5.0–8.0)

## 2020-11-23 LAB — TROPONIN I (HIGH SENSITIVITY)
Troponin I (High Sensitivity): 3 ng/L (ref <18)
Troponin I (High Sensitivity): 3 ng/L (ref <18)

## 2020-11-23 LAB — LIPASE, BLOOD: Lipase: 29 U/L (ref 11–51)

## 2020-11-23 IMAGING — CT CT ABD-PELV W/O CM
2 of 4 series · 17 of 46 positions shown, 19 images · non-contrast
Comparison: None.

CLINICAL DATA: Acute left lower quadrant abdominal pain.

EXAM:
CT ABDOMEN AND PELVIS WITHOUT CONTRAST
TECHNIQUE: Multidetector CT imaging of the abdomen and pelvis was performed
following the standard protocol without IV contrast.

[Series 2: axial st · axial · 0.91mm/px · z∈[+276,+741]mm · 14 of 103 slices shown, 16 images]
[im 5/103  soft-tissue]
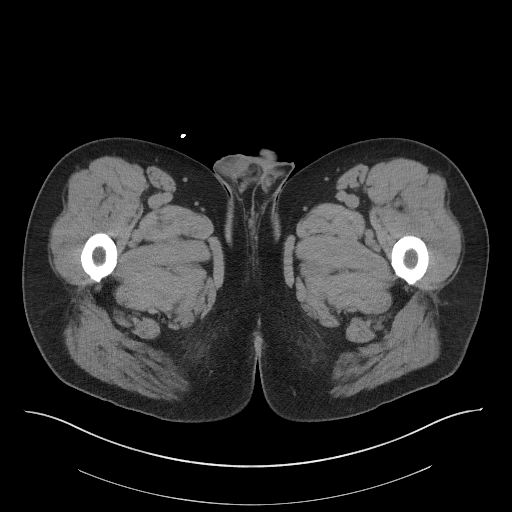
[im 5/103  bone]
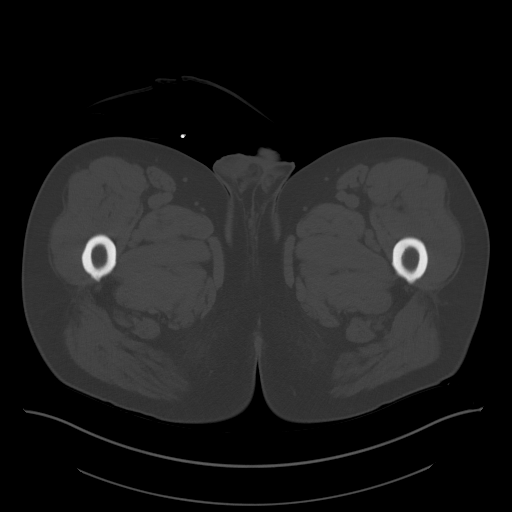
[im 13/103  soft-tissue]
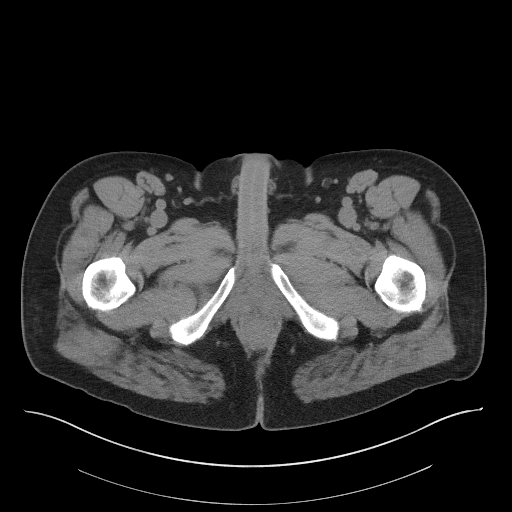
[im 21/103  soft-tissue]
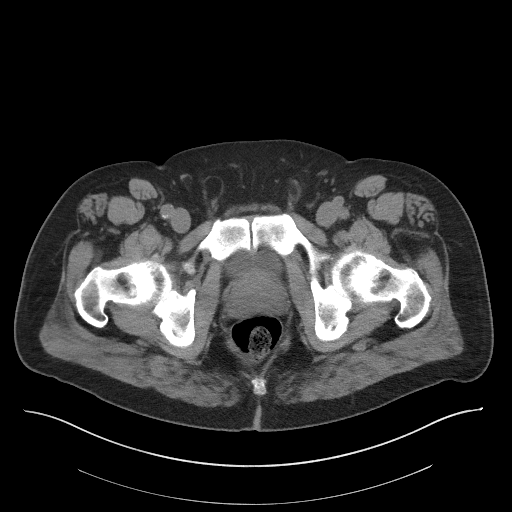
[im 29/103  soft-tissue]
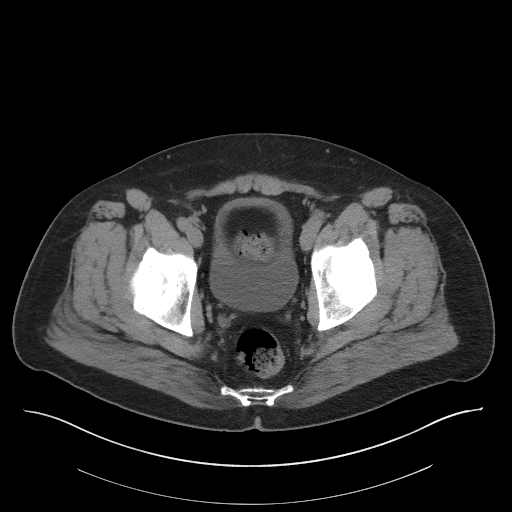
[im 33/103  soft-tissue]
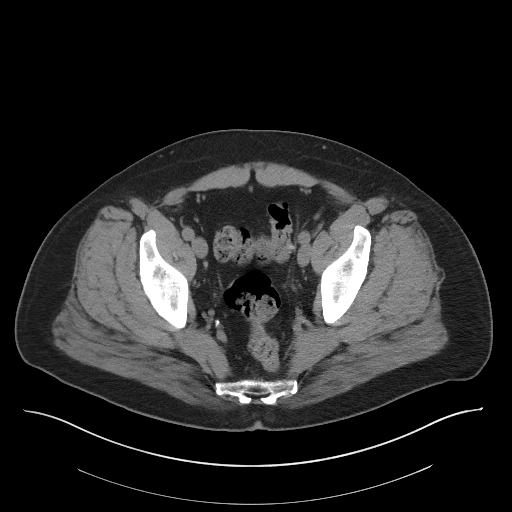
[im 41/103  soft-tissue]
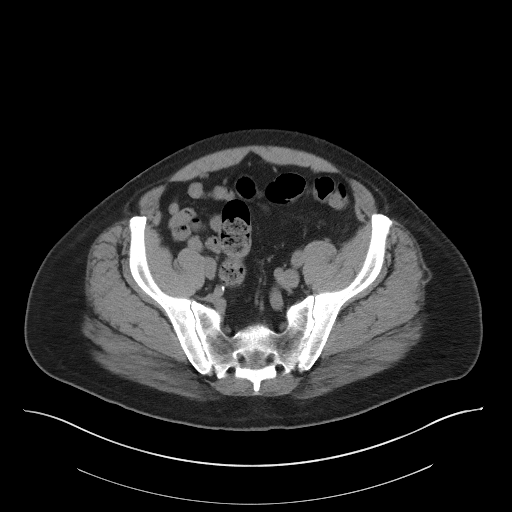
[im 49/103  soft-tissue]
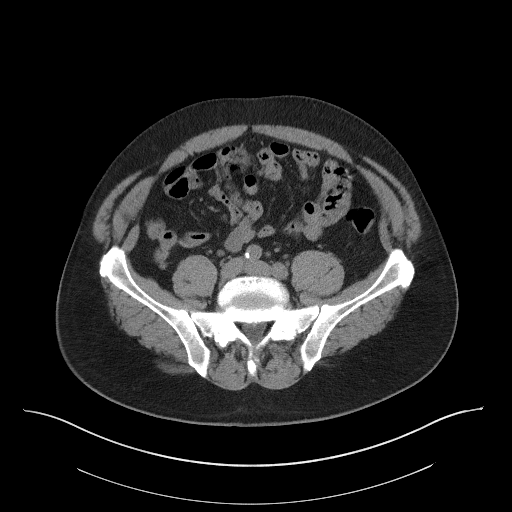
[im 54/103  soft-tissue]
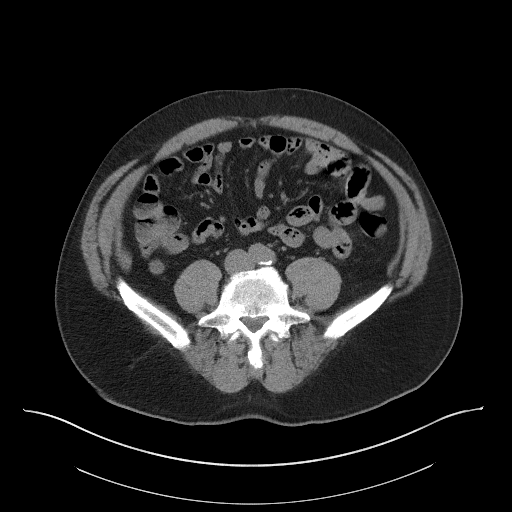
[im 62/103  soft-tissue]
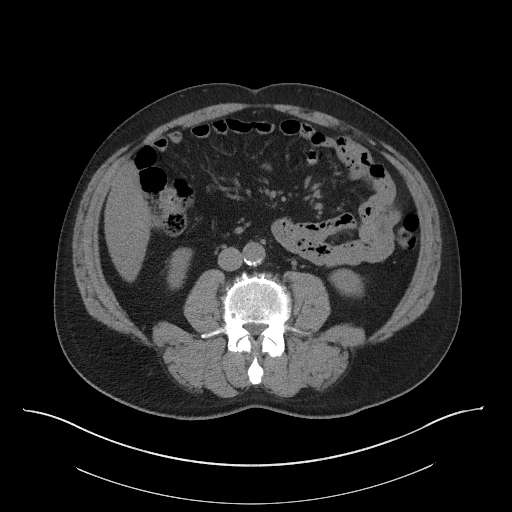
[im 62/103  bone]
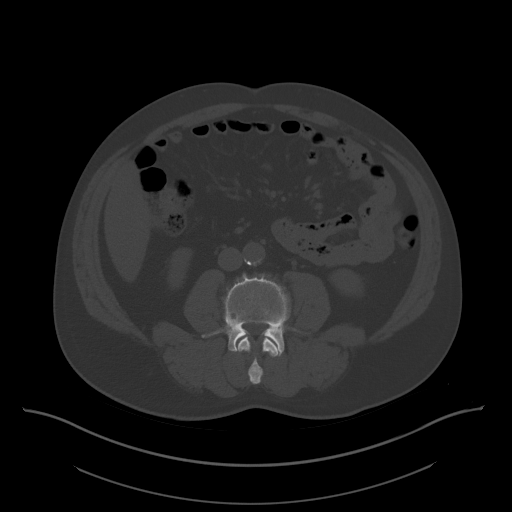
[im 70/103  soft-tissue]
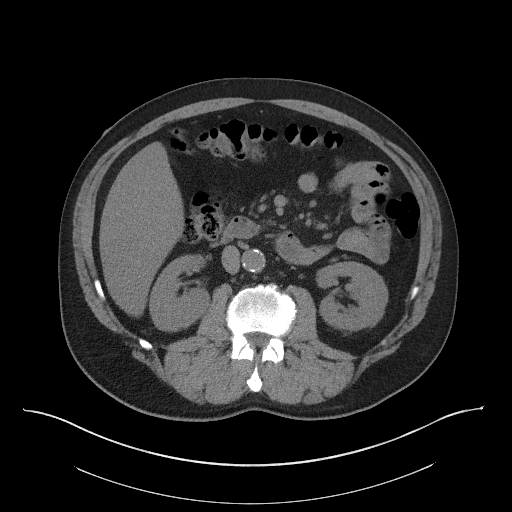
[im 78/103  soft-tissue]
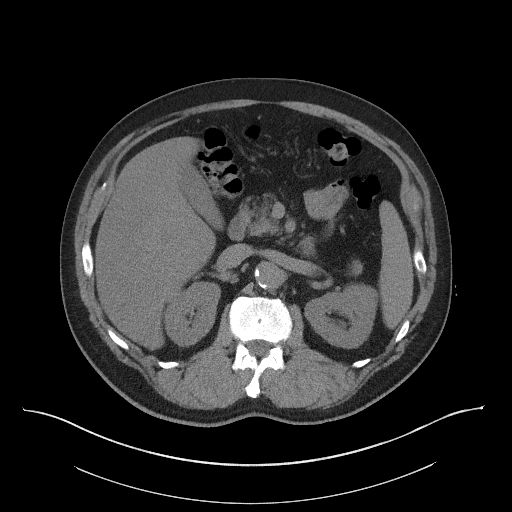
[im 82/103  soft-tissue]
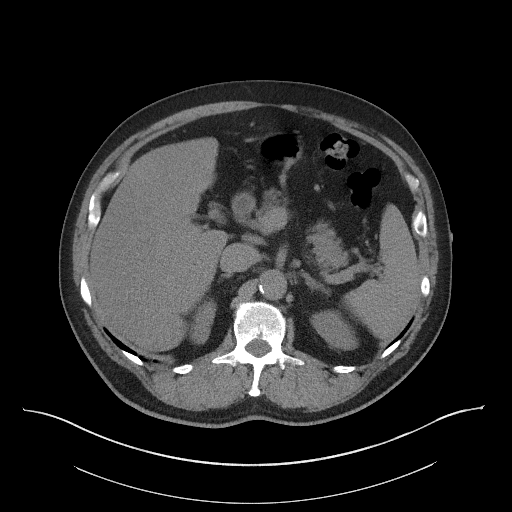
[im 90/103  soft-tissue]
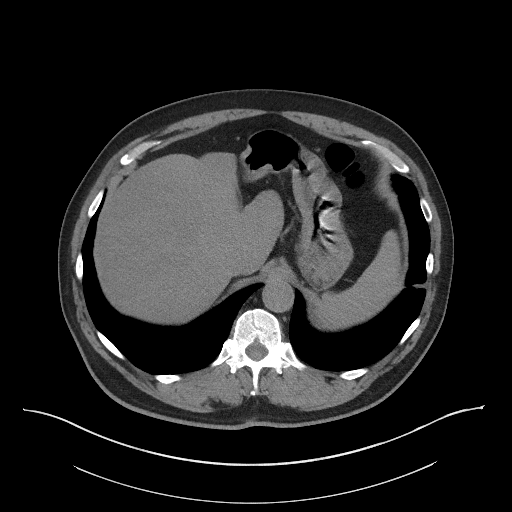
[im 98/103  soft-tissue]
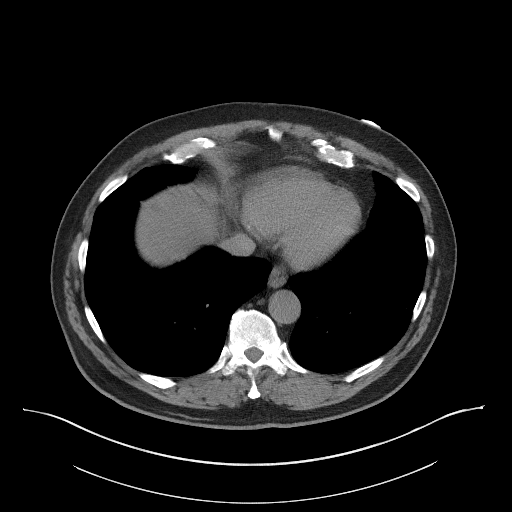

[Series 5: coronal st · coronal · 0.91mm/px · 3 of 101 slices shown]
[im 34/101  soft-tissue]
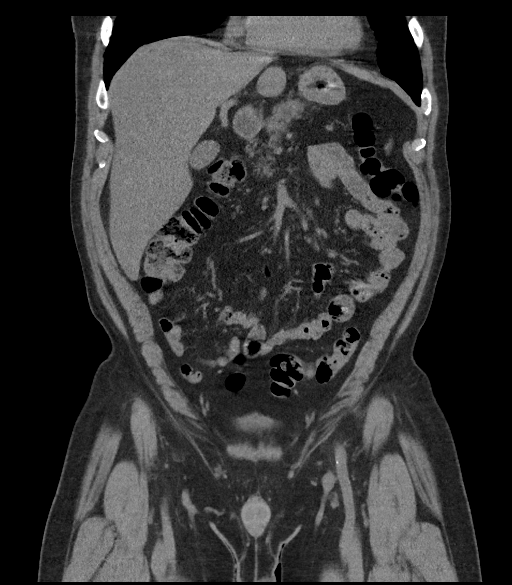
[im 45/101  soft-tissue]
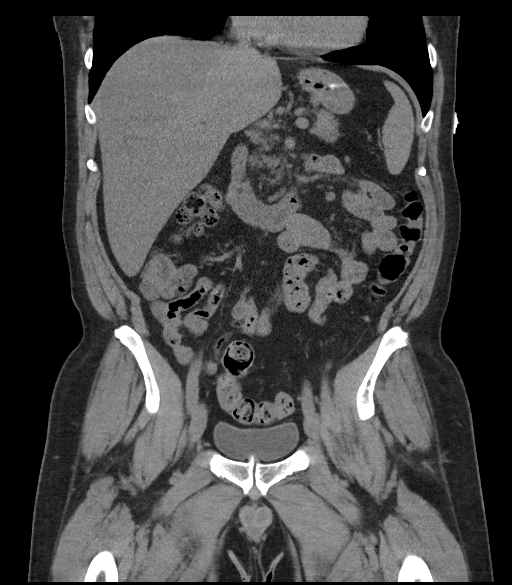
[im 56/101  soft-tissue]
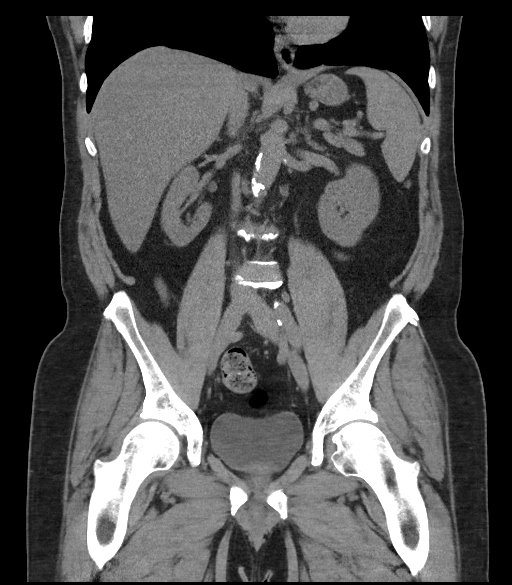

[17 of 46 positions shown; findings below may reference images not displayed]

FINDINGS: Lower chest: No acute abnormality.

Hepatobiliary: No gallstones or biliary dilatation is noted. Hepatic
steatosis is noted.

Pancreas: Unremarkable. No pancreatic ductal dilatation or
surrounding inflammatory changes.

Spleen: Normal in size without focal abnormality.

Adrenals/Urinary Tract: Adrenal glands are unremarkable. Kidneys are
normal, without renal calculi, focal lesion, or hydronephrosis.
Bladder is unremarkable.

Stomach/Bowel: Stomach is within normal limits. The appendix is
dilated at 13 mm proximally as well as being fluid-filled, but no
surrounding inflammation is noted. No evidence of bowel wall
thickening, distention, or inflammatory changes.

Vascular/Lymphatic: Aortic atherosclerosis. No enlarged abdominal or
pelvic lymph nodes.

Reproductive: Prostate is unremarkable.

Other: No abdominal wall hernia or abnormality. No abdominopelvic
ascites.

Musculoskeletal: No acute or significant osseous findings.
IMPRESSION: Hepatic steatosis.

The appendix is dilated proximally, but no surrounding inflammation
is noted. These findings are equivocal for appendicitis. Correlation
with clinical and laboratory findings is recommended.

No other abnormality seen in the abdomen or pelvis.

## 2020-11-23 IMAGING — CR DG CHEST 2V
2 series · 2 of 2 positions shown · non-contrast
Comparison: [DATE]

CLINICAL DATA: Shortness of breath

EXAM:
CHEST - 2 VIEW

[chest pa]
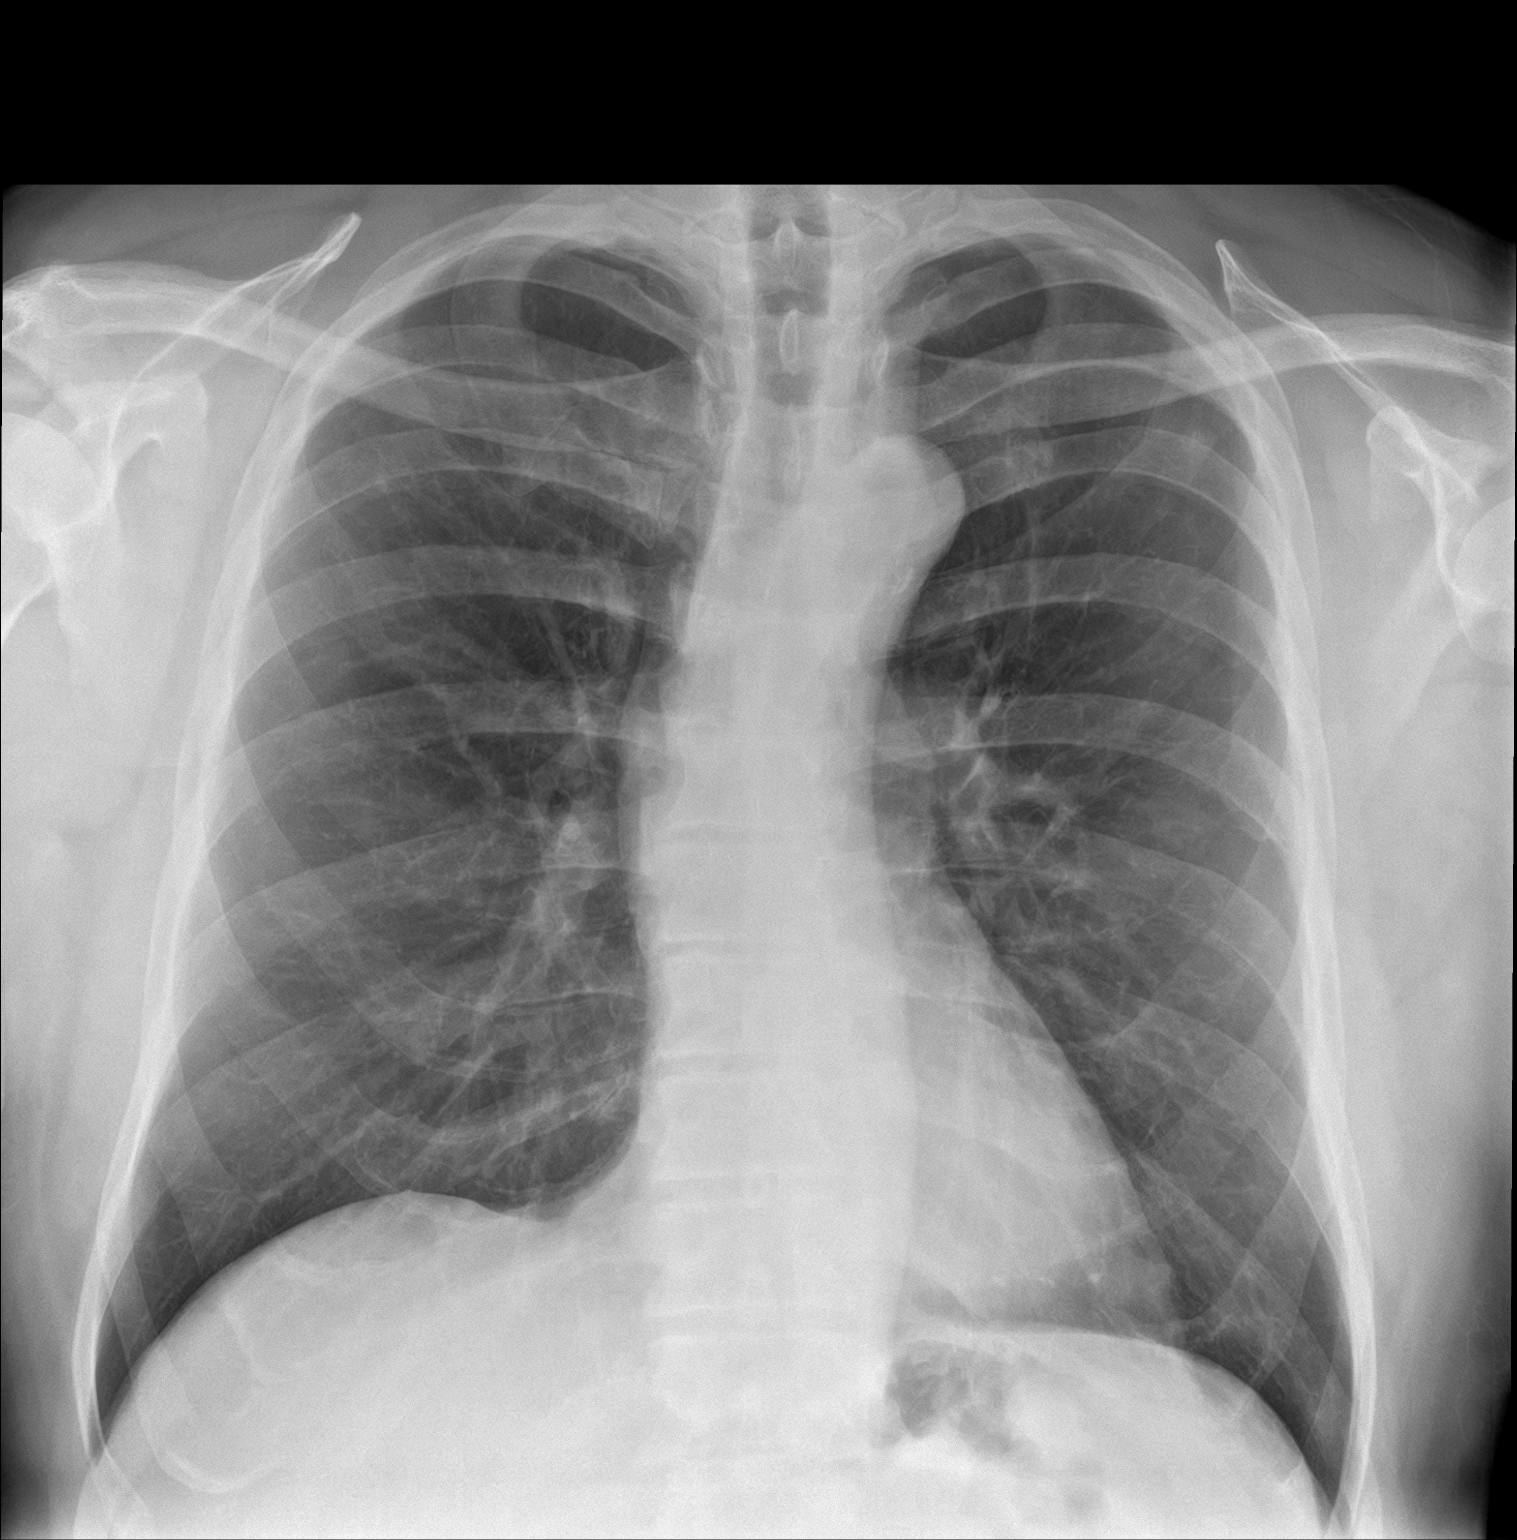

[chest lat]
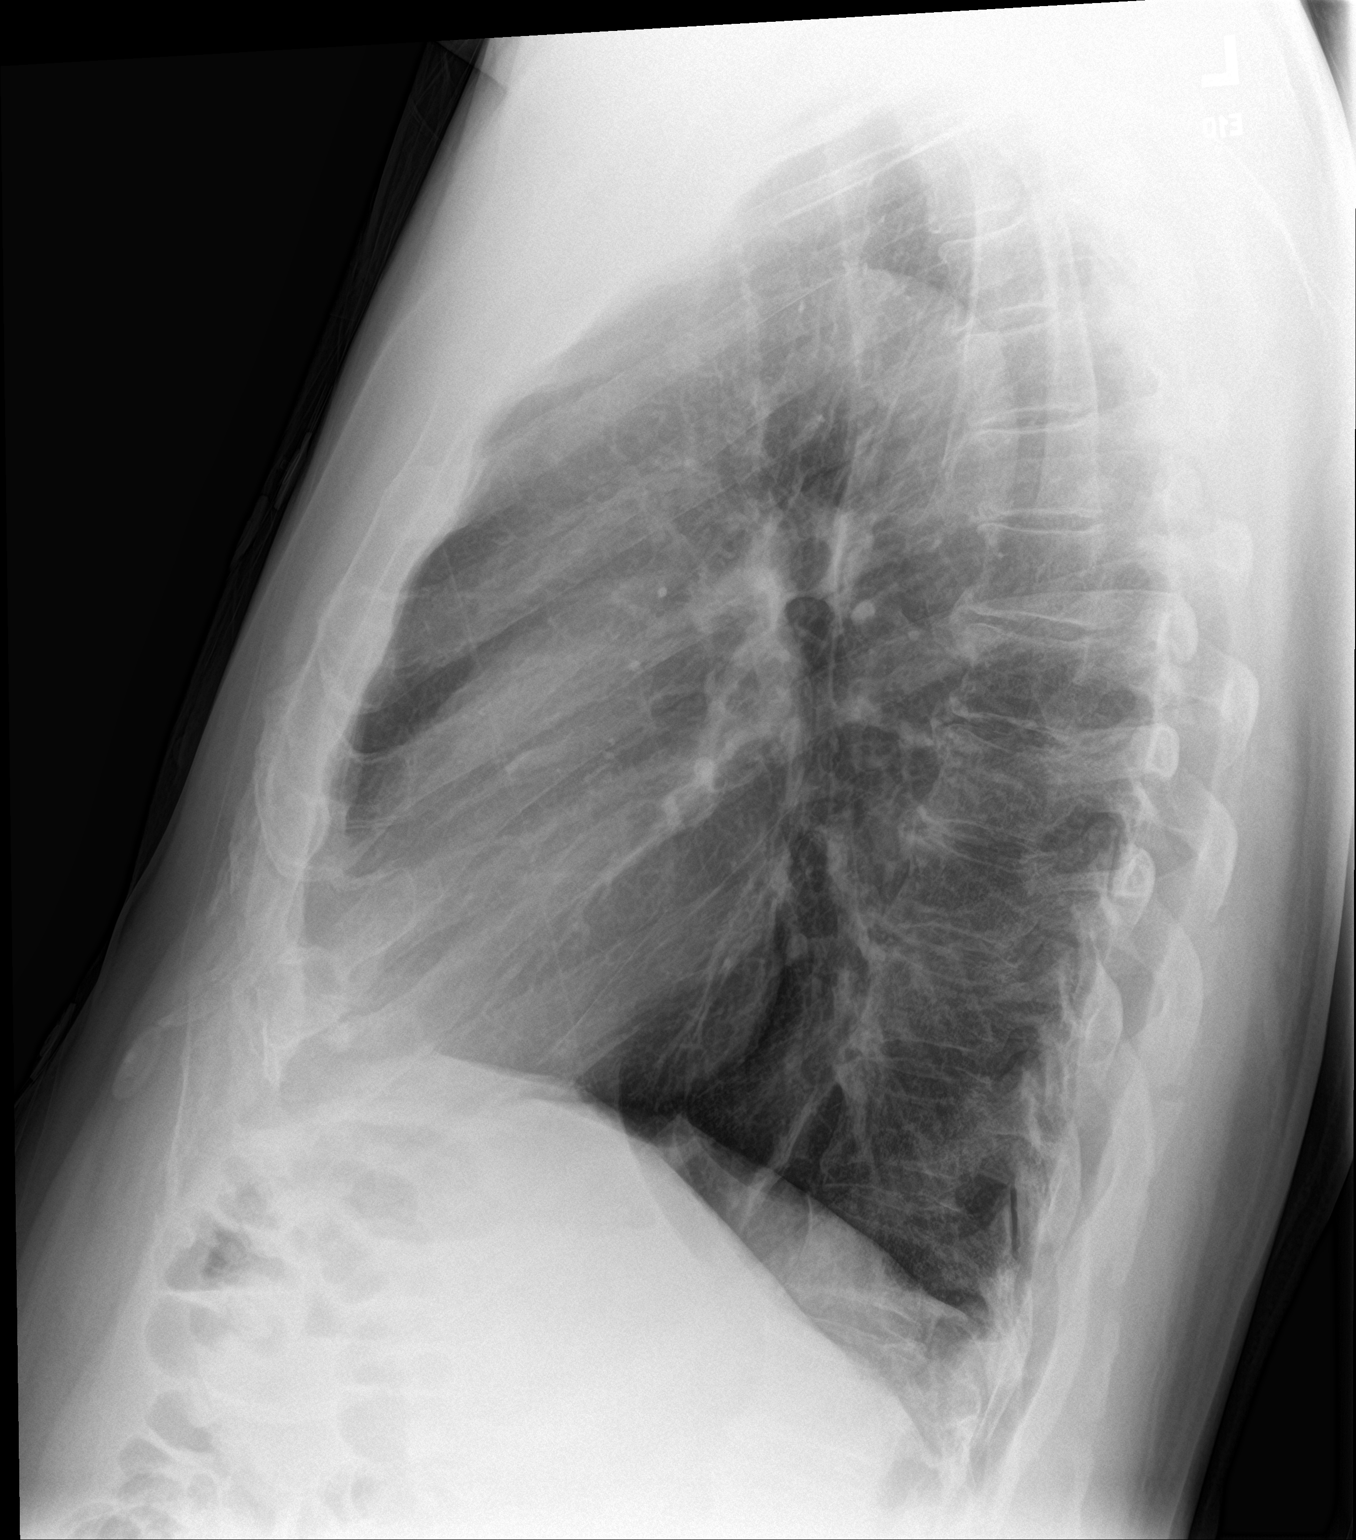

[2 of 2 positions shown; findings below may reference images not displayed]

FINDINGS: Lungs are clear. Heart size and pulmonary vascularity are normal. No
adenopathy. No bone lesions.
IMPRESSION: Lungs clear.  Cardiac silhouette normal.

## 2020-11-23 NOTE — ED Provider Notes (Signed)
[facility] Emergency Department Provider Note  ____________________________________________   Event Date/Time   First MD Initiated Contact with Patient 11/23/20 1351     (approximate)  I have reviewed the triage vital signs and the nursing notes.   HISTORY  Chief Complaint Abdominal Pain    HPI James Coleman is a 65 y.o. male with hypertension who comes in with left upper quadrant abdominal pain.  Patient reports a nagging, dull ache to his left upper quadrant, nonradiating for the past week, nothing makes it better, nothing makes it worse.  Denies any shortness of breath associated with it.  States that he has a family history of cardiac disease anyone to make sure that his heart looked good.  He also reports getting work-up for prostate cancer.  He does report a lot of stress and states he just wants to make sure that everything looks good.  He denies any shortness of breath.  Does have a remote history of smoking, alcohol use but none over a decade.  Denies drugs.       Past Medical History:  Diagnosis Date  . History of basal cell carcinoma 07/10/2014   R medial canthus  . History of dysplastic nevus 01/27/2016   left mid back  . 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   History reviewed. No pertinent surgical history.  Prior to Admission medications   Not on File    Allergies Patient has no known allergies.  History reviewed. No pertinent family history.  Social History  Prior alcohol and cigarette use but denies any currently   Review of Systems Constitutional: No fever/chills Eyes: No visual changes. ENT: No sore throat. Cardiovascular: Denies chest pain. Respiratory: Denies shortness of breath. Gastrointestinal: Positive abdominal pain no nausea, no vomiting.  No diarrhea.  No constipation. Genitourinary: Negative for dysuria. Musculoskeletal:  Negative for back pain. Skin: Negative for rash. Neurological: Negative for headaches, focal weakness or numbness. All other ROS negative ____________________________________________   PHYSICAL EXAM:  VITAL SIGNS: ED Triage Vitals  Enc Vitals Group     BP 11/23/20 1235 (!) 181/115     Pulse Rate 11/23/20 1235 84     Resp 11/23/20 1235 20     Temp 11/23/20 1235 97.8 F (36.6 C)     Temp Source 11/23/20 1235 Oral     SpO2 11/23/20 1235 94 %     Weight 11/23/20 1235 230 lb (104.3 kg)     Height 11/23/20 1235 6\' 1"  (1.854 m)     Head Circumference --      Peak Flow --      Pain Score 11/23/20 1234 6     Pain Loc --      Pain Edu? --      Excl. in [place]? --     Constitutional: Alert and oriented. Well appearing and in no acute distress. Eyes: Conjunctivae are normal. EOMI. Head: Atraumatic. Nose: No congestion/rhinnorhea. Mouth/Throat: Mucous membranes are moist.   Neck: No stridor. Trachea Midline. FROM Cardiovascular: Normal rate, regular rhythm. Grossly normal heart sounds.  Good peripheral circulation. Respiratory: Normal respiratory effort.  No retractions. Lungs CTAB. Gastrointestinal: Soft but slight tenderness in the left upper quadrant no distention. No abdominal bruits.  Musculoskeletal: No lower extremity tenderness nor edema.  No joint effusions. Neurologic:  Normal speech and language. No gross focal neurologic deficits are appreciated.  Skin:  Skin  is warm, dry and intact. No rash noted. Psychiatric: Mood and affect are normal. Speech and behavior are normal. GU: Deferred   ____________________________________________   LABS (all labs ordered are listed, but only abnormal results are displayed)  Labs Reviewed  COMPREHENSIVE METABOLIC PANEL - Abnormal; Notable for the following components:      Result Value   Glucose, Bld 120 (*)    All other components within normal limits  LIPASE, BLOOD  CBC  URINALYSIS, COMPLETE (UACMP) WITH MICROSCOPIC  TROPONIN I  (HIGH SENSITIVITY)   ____________________________________________   ED ECG REPORT I, Vanessa Montello, the attending physician, personally viewed and interpreted this ECG.  Normal sinus rate of 80, no ST elevation, T wave version aVL, normal intervals ____________________________________________  RADIOLOGY Robert Bellow, personally viewed and evaluated these images (plain radiographs) as part of my medical decision making, as well as reviewing the written report by the radiologist.  ED MD interpretation: No pneumonia  Official radiology report(s): CT ABDOMEN PELVIS WO CONTRAST  Result Date: 11/23/2020 CLINICAL DATA:  Acute left lower quadrant abdominal pain. EXAM: CT ABDOMEN AND PELVIS WITHOUT CONTRAST TECHNIQUE: Multidetector CT imaging of the abdomen and pelvis was performed following the standard protocol without IV contrast. COMPARISON:  None. FINDINGS: Lower chest: No acute abnormality. Hepatobiliary: No gallstones or biliary dilatation is noted. Hepatic steatosis is noted. Pancreas: Unremarkable. No pancreatic ductal dilatation or surrounding inflammatory changes. Spleen: Normal in size without focal abnormality. Adrenals/Urinary Tract: Adrenal glands are unremarkable. Kidneys are normal, without renal calculi, focal lesion, or hydronephrosis. Bladder is unremarkable. Stomach/Bowel: Stomach is within normal limits. The appendix is dilated at 13 mm proximally as well as being fluid-filled, but no surrounding inflammation is noted. No evidence of bowel wall thickening, distention, or inflammatory changes. Vascular/Lymphatic: Aortic atherosclerosis. No enlarged abdominal or pelvic lymph nodes. Reproductive: Prostate is unremarkable. Other: No abdominal wall hernia or abnormality. No abdominopelvic ascites. Musculoskeletal: No acute or significant osseous findings. IMPRESSION: Hepatic steatosis. The appendix is dilated proximally, but no surrounding inflammation is noted. These findings are  equivocal for appendicitis. Correlation with clinical and laboratory findings is recommended. No other abnormality seen in the abdomen or pelvis. Electronically Signed   By: Marijo Conception M.D.   On: 11/23/2020 15:20   DG Chest 2 View  Result Date: 11/23/2020 CLINICAL DATA:  Shortness of breath EXAM: CHEST - 2 VIEW COMPARISON:  July 04, 2011 FINDINGS: Lungs are clear. Heart size and pulmonary vascularity are normal. No adenopathy. No bone lesions. IMPRESSION: Lungs clear.  Cardiac silhouette normal. Electronically Signed   By: Lowella Grip III M.D.   On: 11/23/2020 14:46    ____________________________________________   PROCEDURES  Procedure(s) performed (including Critical Care):  Procedures   ____________________________________________   INITIAL IMPRESSION / ASSESSMENT AND PLAN / ED COURSE  JAMESPAUL SECRIST was evaluated in Emergency Department on 11/23/2020 for the symptoms described in the history of present illness. He was evaluated in the context of the global COVID-19 pandemic, which necessitated consideration that the patient might be at risk for infection with the SARS-CoV-2 virus that causes COVID-19. Institutional protocols and algorithms that pertain to the evaluation of patients at risk for COVID-19 are in a state of rapid change based on information released by regulatory bodies including the CDC and federal and state organizations. These policies and algorithms were followed during the patient's care in the ED.    Patient is well-appearing 65 year old who comes in with left upper quadrant pain.  Cardiac markers  were ordered to evaluate for ACS.  Chest x-ray ordered to evaluate for pneumonia, pneumothorax.  Will get CT abdomen to rule out any kind of mass given patient is being worked up for prostate cancer.  Cardiac markers are negative x2.  Urine without evidence of UTI.  CT scan does show dilated proximal appendix but he has no right lower quadrant tenderness count  is normal no fever.  Discussed with patient this results and I have very low suspicion for appendicitis but we discussed return precautions in regards to this.  His chest x-ray is without evidence of pneumonia.  Patient is happy about reassuring work-up and feels comfortable with discharge home.  Patient was initially hypertensive but he states that his blood pressures are just high now because he is in the emergency room and typically run in the 140s.  On recheck here it was 142/90 which is near his baseline..  Do not think this is a dissection given it has been going on over a week.  This patient was evaluated during a time of global shortage of iodinated contrast media. Based on guidance from the [employer] of Radiology, best practices, and local institutional approaches an alternative path for evaluating and managing the patient may have been employed in order to provide optimal care during this shortage. The current situation has been discussed with the patient.      I discussed the provisional nature of ED diagnosis, the treatment so far, the ongoing plan of care, follow up appointments and return precautions with the patient and any family or support people present. They expressed understanding and agreed with the plan, discharged home.           ____________________________________________   FINAL CLINICAL IMPRESSION(S) / ED DIAGNOSES   Final diagnoses:  Abdominal pain, unspecified abdominal location      MEDICATIONS GIVEN DURING THIS VISIT:  Medications - No data to display   ED Discharge Orders    None       Note:  This document was prepared using Dragon voice recognition software and may include unintentional dictation errors.   Vanessa Garrochales, MD 11/23/20 [phone]

## 2020-11-23 NOTE — ED Notes (Signed)
ED Provider at bedside. 

## 2020-11-23 NOTE — ED Notes (Signed)
Discharge instructions reviewed pt calm ,collective , denied pain or sob

## 2020-11-23 NOTE — Discharge Instructions (Addendum)
Your CT is as below.  If you develop any right lower quadrant pain return to the ER.  However at this time the discomfort you noted in the upper abdomen does not appear to be life-threatening.  He can follow-up with cardiology.  Please call them to make an appointment.  Your blood pressure slightly elevated and you can discuss this further with them or your primary care doctor.     IMPRESSION:  Hepatic steatosis.   The appendix is dilated proximally, but no surrounding inflammation  is noted. These findings are equivocal for appendicitis. Correlation  with clinical and laboratory findings is recommended.   No other abnormality seen in the abdomen or pelvis.

## 2020-11-23 NOTE — ED Triage Notes (Signed)
Pt referred to ED by PCP, pt c/o LUQ heaviness. Pt states concerns for heart disease due to family hx of heart diease. Pt denies CP, denies arm heaviness, pt A&O x4, NAD noted at this time. Pt states heaviness to LUQ abdomen x 1 week.

## 2020-11-26 ENCOUNTER — Ambulatory Visit: Payer: BC Managed Care – PPO | Admitting: Dermatology

## 2021-02-08 ENCOUNTER — Ambulatory Visit: Payer: BC Managed Care – PPO | Admitting: Dermatology

## 2021-02-18 ENCOUNTER — Other Ambulatory Visit: Payer: Self-pay

## 2021-02-18 ENCOUNTER — Ambulatory Visit: Payer: BC Managed Care – PPO | Admitting: Dermatology

## 2021-02-18 DIAGNOSIS — D1800 Hemangioma unspecified site: Secondary | ICD-10-CM

## 2021-02-18 DIAGNOSIS — Z86018 Personal history of other benign neoplasm: Secondary | ICD-10-CM

## 2021-02-18 DIAGNOSIS — D492 Neoplasm of unspecified behavior of bone, soft tissue, and skin: Secondary | ICD-10-CM

## 2021-02-18 DIAGNOSIS — Z85820 Personal history of malignant melanoma of skin: Secondary | ICD-10-CM | POA: Diagnosis not present

## 2021-02-18 DIAGNOSIS — Z1283 Encounter for screening for malignant neoplasm of skin: Secondary | ICD-10-CM | POA: Diagnosis not present

## 2021-02-18 DIAGNOSIS — L820 Inflamed seborrheic keratosis: Secondary | ICD-10-CM

## 2021-02-18 DIAGNOSIS — L578 Other skin changes due to chronic exposure to nonionizing radiation: Secondary | ICD-10-CM

## 2021-02-18 DIAGNOSIS — Z85828 Personal history of other malignant neoplasm of skin: Secondary | ICD-10-CM

## 2021-02-18 DIAGNOSIS — L570 Actinic keratosis: Secondary | ICD-10-CM | POA: Diagnosis not present

## 2021-02-18 DIAGNOSIS — L814 Other melanin hyperpigmentation: Secondary | ICD-10-CM

## 2021-02-18 DIAGNOSIS — L821 Other seborrheic keratosis: Secondary | ICD-10-CM

## 2021-02-18 DIAGNOSIS — D229 Melanocytic nevi, unspecified: Secondary | ICD-10-CM | POA: Diagnosis not present

## 2021-02-18 DIAGNOSIS — L723 Sebaceous cyst: Secondary | ICD-10-CM

## 2021-02-18 NOTE — Progress Notes (Signed)
Follow-Up Visit   Subjective  James Coleman is a 65 y.o. male who presents for the following: Annual Exam (Mole check ). Yearly mole check, pt c/o several spots on his body growing and changing Hx of Melanoma on the back The patient presents for Total-Body Skin Exam (TBSE) for skin cancer screening and mole check.   The following portions of the chart were reviewed this encounter and updated as appropriate:   Allergies  Meds  Problems  Med Hx  Surg Hx  Fam Hx     Review of Systems:  No other skin or systemic complaints except as noted in HPI or Assessment and Plan.  Objective  Well appearing patient in no apparent distress; mood and affect are within normal limits.  A full examination was performed including scalp, head, eyes, ears, nose, lips, neck, chest, axillae, abdomen, back, buttocks, bilateral upper extremities, bilateral lower extremities, hands, feet, fingers, toes, fingernails, and toenails. All findings within normal limits unless otherwise noted below.  right forehead, left arm, chest, legs, back  (17) (17) Erythematous keratotic or waxy stuck-on papule or plaque.   face (3) Erythematous thin papules/macules with gritty scale.   right lateral 2nd toe 0.6 cm papule    Assessment & Plan  Inflamed seborrheic keratosis right forehead, left arm, chest, legs, back  (17)  Destruction of lesion - right forehead, left arm, chest, legs, back  (17) Complexity: simple   Destruction method: cryotherapy   Informed consent: discussed and consent obtained   Timeout:  patient name, date of birth, surgical site, and procedure verified Lesion destroyed using liquid nitrogen: Yes   Region frozen until ice ball extended beyond lesion: Yes   Outcome: patient tolerated procedure well with no complications   Post-procedure details: wound care instructions given    AK (actinic keratosis) (3) face  Actinic keratoses are precancerous spots that appear secondary to cumulative  UV radiation exposure/sun exposure over time. They are chronic with expected duration over 1 year. A portion of actinic keratoses will progress to squamous cell carcinoma of the skin. It is not possible to reliably predict which spots will progress to skin cancer and so treatment is recommended to prevent development of skin cancer.  Recommend daily broad spectrum sunscreen SPF 30+ to sun-exposed areas, reapply every 2 hours as needed.  Recommend staying in the shade or wearing long sleeves, sun glasses (UVA+UVB protection) and wide brim hats (4-inch brim around the entire circumference of the hat). Call for new or changing lesions.   Destruction of lesion - face Complexity: simple   Destruction method: cryotherapy   Informed consent: discussed and consent obtained   Timeout:  patient name, date of birth, surgical site, and procedure verified Lesion destroyed using liquid nitrogen: Yes   Region frozen until ice ball extended beyond lesion: Yes   Outcome: patient tolerated procedure well with no complications   Post-procedure details: wound care instructions given    Neoplasm of skin right lateral 2nd toe  Epidermal / dermal shaving  Lesion diameter (cm):  0.6 Informed consent: discussed and consent obtained   Timeout: patient name, date of birth, surgical site, and procedure verified   Procedure prep:  Patient was prepped and draped in usual sterile fashion Prep type:  Isopropyl alcohol Anesthesia: the lesion was anesthetized in a standard fashion   Anesthetic:  1% lidocaine w/ epinephrine 1-100,000 buffered w/ 8.4% NaHCO3 Hemostasis achieved with: pressure, aluminum chloride and electrodesiccation   Outcome: patient tolerated procedure well  Post-procedure details: sterile dressing applied and wound care instructions given   Dressing type: bandage and petrolatum    Specimen 1 - Surgical pathology Differential Diagnosis: R/O Callus vs other   Check Margins: No  History of  Melanoma in situ Right mid back 2013 - No evidence of recurrence today - No lymphadenopathy - Recommend regular full body skin exams - Recommend daily broad spectrum sunscreen SPF 30+ to sun-exposed areas, reapply every 2 hours as needed.  - Call if any new or changing lesions are noted between office visits   Lentigines - Scattered tan macules - Due to sun exposure - Benign-appering, observe - Recommend daily broad spectrum sunscreen SPF 30+ to sun-exposed areas, reapply every 2 hours as needed. - Call for any changes  Seborrheic Keratoses - Stuck-on, waxy, tan-brown papules and/or plaques  - Benign-appearing - Discussed benign etiology and prognosis. - Observe - Call for any changes  Melanocytic Nevi - Tan-brown and/or pink-flesh-colored symmetric macules and papules - Benign appearing on exam today - Observation - Call clinic for new or changing moles - Recommend daily use of broad spectrum spf 30+ sunscreen to sun-exposed areas.   Hemangiomas - Red papules - Discussed benign nature - Observe - Call for any changes  Actinic Damage - Chronic condition, secondary to cumulative UV/sun exposure - diffuse scaly erythematous macules with underlying dyspigmentation - Recommend daily broad spectrum sunscreen SPF 30+ to sun-exposed areas, reapply every 2 hours as needed.  - Staying in the shade or wearing long sleeves, sun glasses (UVA+UVB protection) and wide brim hats (4-inch brim around the entire circumference of the hat) are also recommended for sun protection.  - Call for new or changing lesions.  History of Basal Cell Carcinoma of the Skin Right medial canthus 2016 - No evidence of recurrence today - Recommend regular full body skin exams - Recommend daily broad spectrum sunscreen SPF 30+ to sun-exposed areas, reapply every 2 hours as needed.  - Call if any new or changing lesions are noted between office visits   History of Dysplastic Nevi Left mid back 2017 - No  evidence of recurrence today - Recommend regular full body skin exams - Recommend daily broad spectrum sunscreen SPF 30+ to sun-exposed areas, reapply every 2 hours as needed.  - Call if any new or changing lesions are noted between office visits  Skin cancer screening performed today.   Return in about 6 months (around 08/18/2021) for TBSE, hx of Melanoma, hx of BCC.  IMarye Round, CMA, am acting as scribe for Sarina Ser, MD .  Documentation: I have reviewed the above documentation for accuracy and completeness, and I agree with the above.  Sarina Ser, MD

## 2021-02-18 NOTE — Patient Instructions (Addendum)
Wound Care Instructions  Cleanse wound gently with soap and water once a day then pat dry with clean gauze. Apply a thing coat of Petrolatum (petroleum jelly, "Vaseline") over the wound (unless you have an allergy to this). We recommend that you use a new, sterile tube of Vaseline. Do not pick or remove scabs. Do not remove the yellow or white "healing tissue" from the base of the wound.  Cover the wound with fresh, clean, nonstick gauze and secure with paper tape. You may use Band-Aids in place of gauze and tape if the would is small enough, but would recommend trimming much of the tape off as there is often too much. Sometimes Band-Aids can irritate the skin.  You should call the office for your biopsy report after 1 week if you have not already been contacted.  If you experience any problems, such as abnormal amounts of bleeding, swelling, significant bruising, significant pain, or evidence of infection, please call the office immediately.  FOR ADULT SURGERY PATIENTS: If you need something for pain relief you may take 1 extra strength Tylenol (acetaminophen) AND 2 Ibuprofen (200mg each) together every 4 hours as needed for pain. (do not take these if you are allergic to them or if you have a reason you should not take them.) Typically, you may only need pain medication for 1 to 3 days.    Cryotherapy Aftercare  Wash gently with soap and water everyday.   Apply Vaseline and Band-Aid daily until healed.    If you have any questions or concerns for your doctor, please call our main line at [phone] and press option 4 to reach your doctor's medical assistant. If no one answers, please leave a voicemail as directed and we will return your call as soon as possible. Messages left after 4 pm will be answered the following business day.   You may also send us a message via MyChart. We typically respond to MyChart messages within 1-2 business days.  For prescription refills, please ask your  pharmacy to contact our office. Our fax number is [phone].  If you have an urgent issue when the clinic is closed that cannot wait until the next business day, you can page your doctor at the number below.    Please note that while we do our best to be available for urgent issues outside of office hours, we are not available 24/7.   If you have an urgent issue and are unable to reach us, you may choose to seek medical care at your doctor's office, retail clinic, urgent care center, or emergency room.  If you have a medical emergency, please immediately call 911 or go to the emergency department.  Pager Numbers  - Dr. Kowalski: [phone]  - Dr. Moye: [phone]  - Dr. Stewart: [phone]  In the event of inclement weather, please call our main line at [phone] for an update on the status of any delays or closures.  Dermatology Medication Tips: Please keep the boxes that topical medications come in in order to help keep track of the instructions about where and how to use these. Pharmacies typically print the medication instructions only on the boxes and not directly on the medication tubes.   If your medication is too expensive, please contact our office at [phone] option 4 or send us a message through MyChart.   We are unable to tell what your co-pay for medications will be in advance as this is different depending on your insurance coverage. However,   we may be able to find a substitute medication at lower cost or fill out paperwork to get insurance to cover a needed medication.   If a prior authorization is required to get your medication covered by your insurance company, please allow us 1-2 business days to complete this process.  Drug prices often vary depending on where the prescription is filled and some pharmacies may offer cheaper prices.  The website www.goodrx.com contains coupons for medications through different pharmacies. The prices here do not  account for what the cost may be with help from insurance (it may be cheaper with your insurance), but the website can give you the price if you did not use any insurance.  - You can print the associated coupon and take it with your prescription to the pharmacy.  - You may also stop by our office during regular business hours and pick up a GoodRx coupon card.  - If you need your prescription sent electronically to a different pharmacy, notify our office through [place] MyChart or by phone at [phone] option 4.  

## 2021-02-24 ENCOUNTER — Telehealth: Payer: Self-pay

## 2021-02-24 NOTE — Telephone Encounter (Signed)
Left message for patient to call office for results/hd 

## 2021-02-24 NOTE — Telephone Encounter (Signed)
-----   Message from Ralene Bathe, MD sent at 02/24/2021  2:26 PM EDT ----- Diagnosis Skin , right lateral 2nd toe DIGITAL MUCOUS CYST, BASE INVOLVED  Benign digital mucous cyst May recur No further treatment needed

## 2021-02-25 ENCOUNTER — Encounter: Payer: Self-pay | Admitting: Dermatology

## 2021-03-12 ENCOUNTER — Telehealth: Payer: Self-pay

## 2021-03-12 NOTE — Telephone Encounter (Signed)
-----   Message from Ralene Bathe, MD sent at 02/24/2021  2:26 PM EDT ----- Diagnosis Skin , right lateral 2nd toe DIGITAL MUCOUS CYST, BASE INVOLVED  Benign digital mucous cyst May recur No further treatment needed

## 2021-03-12 NOTE — Telephone Encounter (Signed)
Advised pt of bx results/sh ?

## 2021-05-03 ENCOUNTER — Ambulatory Visit: Payer: BC Managed Care – PPO | Admitting: Dermatology

## 2021-08-19 ENCOUNTER — Other Ambulatory Visit: Payer: Self-pay

## 2021-08-19 ENCOUNTER — Ambulatory Visit: Payer: BC Managed Care – PPO | Admitting: Dermatology

## 2021-08-19 DIAGNOSIS — L814 Other melanin hyperpigmentation: Secondary | ICD-10-CM

## 2021-08-19 DIAGNOSIS — D1800 Hemangioma unspecified site: Secondary | ICD-10-CM

## 2021-08-19 DIAGNOSIS — L578 Other skin changes due to chronic exposure to nonionizing radiation: Secondary | ICD-10-CM

## 2021-08-19 DIAGNOSIS — Z1283 Encounter for screening for malignant neoplasm of skin: Secondary | ICD-10-CM | POA: Diagnosis not present

## 2021-08-19 DIAGNOSIS — L821 Other seborrheic keratosis: Secondary | ICD-10-CM

## 2021-08-19 DIAGNOSIS — Z86006 Personal history of melanoma in-situ: Secondary | ICD-10-CM

## 2021-08-19 DIAGNOSIS — L820 Inflamed seborrheic keratosis: Secondary | ICD-10-CM

## 2021-08-19 DIAGNOSIS — D229 Melanocytic nevi, unspecified: Secondary | ICD-10-CM | POA: Diagnosis not present

## 2021-08-19 DIAGNOSIS — Z85828 Personal history of other malignant neoplasm of skin: Secondary | ICD-10-CM

## 2021-08-19 DIAGNOSIS — Z86018 Personal history of other benign neoplasm: Secondary | ICD-10-CM

## 2021-08-19 DIAGNOSIS — L570 Actinic keratosis: Secondary | ICD-10-CM | POA: Diagnosis not present

## 2021-08-19 NOTE — Patient Instructions (Addendum)

## 2021-08-19 NOTE — Progress Notes (Signed)
? ?Follow-Up Visit ?  ?Subjective  ?James Coleman is a 66 y.o. male who presents for the following: Annual Exam (Mole check ). 6 months f/u hx of Melanoma in situ. ?The patient presents for Total-Body Skin Exam (TBSE) for skin cancer screening and mole check.  The patient has spots, moles and lesions to be evaluated, some may be new or changing and the patient has concerns that these could be cancer.  ? ?The following portions of the chart were reviewed this encounter and updated as appropriate:  ? Allergies  Meds  Problems  Med Hx  Surg Hx  Fam Hx   ?  ?Review of Systems:  No other skin or systemic complaints except as noted in HPI or Assessment and Plan. ? ?Objective  ?Well appearing patient in no apparent distress; mood and affect are within normal limits. ? ?A full examination was performed including scalp, head, eyes, ears, nose, lips, neck, chest, axillae, abdomen, back, buttocks, bilateral upper extremities, bilateral lower extremities, hands, feet, fingers, toes, fingernails, and toenails. All findings within normal limits unless otherwise noted below. ? ?right dorsum forearm x 1, left back x 1, left pectoral x 1  (3) (3) ?Stuck-on, waxy, tan-brown papules ? ?right cheek x 1 ?Erythematous thin papules/macules with gritty scale.  ? ? ?Assessment & Plan  ?Inflamed seborrheic keratosis (3) ?right dorsum forearm x 1, left back x 1, left pectoral x 1  (3) ? ?Reassured benign age-related growth.  Recommend observation.  Discussed cryotherapy if spot(s) become irritated or inflamed.  ? ?Destruction of lesion - right dorsum forearm x 1, left back x 1, left pectoral x 1  (3) ?Complexity: simple   ?Destruction method: cryotherapy   ?Informed consent: discussed and consent obtained   ?Timeout:  patient name, date of birth, surgical site, and procedure verified ?Lesion destroyed using liquid nitrogen: Yes   ?Region frozen until ice ball extended beyond lesion: Yes   ?Outcome: patient tolerated procedure well  with no complications   ?Post-procedure details: wound care instructions given   ? ?AK (actinic keratosis) ?right cheek x 1 ? ?Actinic keratoses are precancerous spots that appear secondary to cumulative UV radiation exposure/sun exposure over time. They are chronic with expected duration over 1 year. A portion of actinic keratoses will progress to squamous cell carcinoma of the skin. It is not possible to reliably predict which spots will progress to skin cancer and so treatment is recommended to prevent development of skin cancer. ? ?Recommend daily broad spectrum sunscreen SPF 30+ to sun-exposed areas, reapply every 2 hours as needed.  ?Recommend staying in the shade or wearing long sleeves, sun glasses (UVA+UVB protection) and wide brim hats (4-inch brim around the entire circumference of the hat). ?Call for new or changing lesions.  ? ?Destruction of lesion - right cheek x 1 ?Complexity: simple   ?Destruction method: cryotherapy   ?Informed consent: discussed and consent obtained   ?Timeout:  patient name, date of birth, surgical site, and procedure verified ?Lesion destroyed using liquid nitrogen: Yes   ?Region frozen until ice ball extended beyond lesion: Yes   ?Outcome: patient tolerated procedure well with no complications   ?Post-procedure details: wound care instructions given   ? ?Skin cancer screening ? ?Lentigines ?- Scattered tan macules ?- Due to sun exposure ?- Benign-appearing, observe ?- Recommend daily broad spectrum sunscreen SPF 30+ to sun-exposed areas, reapply every 2 hours as needed. ?- Call for any changes ? ?Seborrheic Keratoses ?- Stuck-on, waxy, tan-brown papules and/or plaques  ?-  Benign-appearing ?- Discussed benign etiology and prognosis. ?- Observe ?- Call for any changes ? ?Melanocytic Nevi ?- Tan-brown and/or pink-flesh-colored symmetric macules and papules ?- Benign appearing on exam today ?- Observation ?- Call clinic for new or changing moles ?- Recommend daily use of broad  spectrum spf 30+ sunscreen to sun-exposed areas.  ? ?Hemangiomas ?- Red papules ?- Discussed benign nature ?- Observe ?- Call for any changes ? ?Actinic Damage ?- Chronic condition, secondary to cumulative UV/sun exposure ?- diffuse scaly erythematous macules with underlying dyspigmentation ?- Recommend daily broad spectrum sunscreen SPF 30+ to sun-exposed areas, reapply every 2 hours as needed.  ?- Staying in the shade or wearing long sleeves, sun glasses (UVA+UVB protection) and wide brim hats (4-inch brim around the entire circumference of the hat) are also recommended for sun protection.  ?- Call for new or changing lesions. ? ?History of Dysplastic Nevi ?Left mid back 2017 ?- No evidence of recurrence today ?- Recommend regular full body skin exams ?- Recommend daily broad spectrum sunscreen SPF 30+ to sun-exposed areas, reapply every 2 hours as needed.  ?- Call if any new or changing lesions are noted between office visits  ? ?History of Basal Cell Carcinoma of the Skin ?Right medial canthus 2016 ?- No evidence of recurrence today ?- Recommend regular full body skin exams ?- Recommend daily broad spectrum sunscreen SPF 30+ to sun-exposed areas, reapply every 2 hours as needed.  ?- Call if any new or changing lesions are noted between office visits  ? ?History of Melanoma in Situ ?Right mid back 2013 ?- No evidence of recurrence today ?- Recommend regular full body skin exams ?- Recommend daily broad spectrum sunscreen SPF 30+ to sun-exposed areas, reapply every 2 hours as needed.  ?- Call if any new or changing lesions are noted between office visits  ? ?Skin cancer screening performed today.  ? ?Return in about 6 months (around 02/19/2022) for TBSE, hx of Melanoma in situ. ? ?I, Marye Round, CMA, am acting as scribe for Sarina Ser, MD .  ?Documentation: I have reviewed the above documentation for accuracy and completeness, and I agree with the above. ? ?Sarina Ser, MD ? ?

## 2021-08-21 ENCOUNTER — Encounter: Payer: Self-pay | Admitting: Dermatology

## 2021-08-24 ENCOUNTER — Encounter: Payer: Self-pay | Admitting: Internal Medicine

## 2021-08-25 ENCOUNTER — Encounter: Payer: Self-pay | Admitting: Internal Medicine

## 2021-08-25 ENCOUNTER — Ambulatory Visit: Payer: BC Managed Care – PPO | Admitting: Certified Registered Nurse Anesthetist

## 2021-08-25 ENCOUNTER — Ambulatory Visit
Admission: RE | Admit: 2021-08-25 | Discharge: 2021-08-25 | Disposition: A | Discharge status: Home / Self Care | Payer: BC Managed Care – PPO | Attending: Internal Medicine | Admitting: Internal Medicine

## 2021-08-25 ENCOUNTER — Encounter
Admission: RE | Disposition: A | Discharge status: Home / Self Care | Payer: Self-pay | Source: Home / Self Care | Attending: Internal Medicine

## 2021-08-25 DIAGNOSIS — I10 Essential (primary) hypertension: Secondary | ICD-10-CM | POA: Diagnosis not present

## 2021-08-25 DIAGNOSIS — Z87891 Personal history of nicotine dependence: Secondary | ICD-10-CM | POA: Insufficient documentation

## 2021-08-25 DIAGNOSIS — Z85820 Personal history of malignant melanoma of skin: Secondary | ICD-10-CM | POA: Insufficient documentation

## 2021-08-25 DIAGNOSIS — D125 Benign neoplasm of sigmoid colon: Secondary | ICD-10-CM | POA: Diagnosis not present

## 2021-08-25 DIAGNOSIS — Z800 Family history of malignant neoplasm of digestive organs: Secondary | ICD-10-CM | POA: Insufficient documentation

## 2021-08-25 DIAGNOSIS — Z85828 Personal history of other malignant neoplasm of skin: Secondary | ICD-10-CM | POA: Diagnosis not present

## 2021-08-25 DIAGNOSIS — K640 First degree hemorrhoids: Secondary | ICD-10-CM | POA: Insufficient documentation

## 2021-08-25 DIAGNOSIS — D123 Benign neoplasm of transverse colon: Secondary | ICD-10-CM | POA: Insufficient documentation

## 2021-08-25 DIAGNOSIS — Z1211 Encounter for screening for malignant neoplasm of colon: Secondary | ICD-10-CM | POA: Diagnosis present

## 2021-08-25 HISTORY — PX: COLONOSCOPY WITH PROPOFOL: SHX5780

## 2021-08-25 HISTORY — DX: Essential (primary) hypertension: I10

## 2021-08-25 SURGERY — COLONOSCOPY WITH PROPOFOL
Anesthesia: General

## 2021-08-25 MED ORDER — LIDOCAINE HCL (CARDIAC) PF 100 MG/5ML IV SOSY
PREFILLED_SYRINGE | INTRAVENOUS | Status: DC | PRN
  Administered 2021-08-25: 50 mg via INTRAVENOUS

## 2021-08-25 MED ORDER — STERILE WATER FOR IRRIGATION IR SOLN
Status: DC | PRN
  Administered 2021-08-25: 120 mL

## 2021-08-25 MED ORDER — SODIUM CHLORIDE 0.9 % IV SOLN: INTRAVENOUS | Status: DC

## 2021-08-25 MED ORDER — PROPOFOL 500 MG/50ML IV EMUL
INTRAVENOUS | Status: DC | PRN
Start: 2021-08-25 — End: 2021-08-25
  Administered 2021-08-25: 140 ug/kg/min via INTRAVENOUS

## 2021-08-25 MED ORDER — PROPOFOL 10 MG/ML IV BOLUS
INTRAVENOUS | Status: DC | PRN
  Administered 2021-08-25: 70 mg via INTRAVENOUS

## 2021-08-25 NOTE — Transfer of Care (Signed)
Immediate Anesthesia Transfer of Care Note ? ?Patient: James Coleman ? ?Procedure(s) Performed: COLONOSCOPY WITH PROPOFOL ? ?Patient Location: Endoscopy Unit ? ?Anesthesia Type:General ? ?Level of Consciousness: drowsy ? ?Airway & Oxygen Therapy: Patient Spontanous Breathing ? ?Post-op Assessment: Report given to RN and Post -op Vital signs reviewed and stable ? ?Post vital signs: Reviewed and stable ? ?Last Vitals:  ?Vitals Value Taken Time  ?BP 110/70 08/25/21 1053  ?Temp    ?Pulse 75 08/25/21 1053  ?Resp 14 08/25/21 1053  ?SpO2 93 % 08/25/21 1053  ?Vitals shown include unvalidated device data. ? ?Last Pain:  ?Vitals:  ? 08/25/21 1004  ?TempSrc: Temporal  ?PainSc: 0-No pain  ?   ? ?  ? ?Complications: No notable events documented. ?

## 2021-08-25 NOTE — Op Note (Signed)
[facility] ?Gastroenterology ?Patient Name: James Coleman ?Procedure Date: 08/25/2021 10:32 AM ?MRN: [id number] ?Account #: [id number] ?Date of Birth: 1956/05/19 ?Admit Type: Outpatient ?Age: 66 ?Room: [facility] ENDO ROOM 2 ?Gender: Male ?Note Status: Finalized ?Instrument Name: Colonoscope [id number] ?Procedure:             Colonoscopy ?Indications:           Surveillance: Personal history of adenomatous polyps  ?                       on last colonoscopy > 5 years ago ?Providers:             Benay Pike. Brylan Seubert MD, MD ?Medicines:             Propofol per Anesthesia ?Complications:         No immediate complications. ?Procedure:             Pre-Anesthesia Assessment: ?                       - The risks and benefits of the procedure and the  ?                       sedation options and risks were discussed with the  ?                       patient. All questions were answered and informed  ?                       consent was obtained. ?                       - Patient identification and proposed procedure were  ?                       verified prior to the procedure by the nurse. The  ?                       procedure was verified in the procedure room. ?                       - ASA Grade Assessment: III - A patient with severe  ?                       systemic disease. ?                       - After reviewing the risks and benefits, the patient  ?                       was deemed in satisfactory condition to undergo the  ?                       procedure. ?                       After obtaining informed consent, the colonoscope was  ?                       passed under direct vision. Throughout the procedure,  ?  the patient's blood pressure, pulse, and oxygen  ?                       saturations were monitored continuously. The  ?                       Colonoscope was introduced through the anus and  ?                       advanced to the the cecum, identified by appendiceal  ?                        orifice and ileocecal valve. The colonoscopy was  ?                       performed without difficulty. The patient tolerated  ?                       the procedure well. The quality of the bowel  ?                       preparation was good. The ileocecal valve, appendiceal  ?                       orifice, and rectum were photographed. ?Findings: ?     The perianal and digital rectal examinations were normal. Pertinent  ?     negatives include normal sphincter tone and no palpable rectal lesions. ?     Non-bleeding internal hemorrhoids were found during retroflexion. The  ?     hemorrhoids were Grade I (internal hemorrhoids that do not prolapse). ?     Two sessile polyps were found in the transverse colon. The polyps were 4  ?     to 5 mm in size. These polyps were removed with a jumbo cold forceps.  ?     Resection and retrieval were complete. ?     Two sessile polyps were found in the proximal sigmoid colon. The polyps  ?     were 3 to 4 mm in size. These polyps were removed with a jumbo cold  ?     forceps. Resection and retrieval were complete. ?     The exam was otherwise without abnormality. ?Impression:            - Non-bleeding internal hemorrhoids. ?                       - Two 4 to 5 mm polyps in the transverse colon,  ?                       removed with a jumbo cold forceps. Resected and  ?                       retrieved. ?                       - Two 3 to 4 mm polyps in the proximal sigmoid colon,  ?                       removed with a jumbo cold forceps. Resected and  ?  retrieved. ?                       - The examination was otherwise normal. ?Recommendation:        - Patient has a contact number available for  ?                       emergencies. The signs and symptoms of potential  ?                       delayed complications were discussed with the patient.  ?                       Return to normal activities tomorrow. Written  ?                        discharge instructions were provided to the patient. ?                       - Resume previous diet. ?                       - Continue present medications. ?                       - Repeat colonoscopy is recommended for surveillance.  ?                       The colonoscopy date will be determined after  ?                       pathology results from today's exam become available  ?                       for review. ?                       - Return to GI office PRN. ?                       - The findings and recommendations were discussed with  ?                       the patient. ?Procedure Code(s):     --- Professional --- ?                       [phone], Colonoscopy, flexible; with biopsy, single or  ?                       multiple ?Diagnosis Code(s):     --- Professional --- ?                       K64.0, First degree hemorrhoids ?                       K63.5, Polyp of colon ?                       Z86.010, Personal history of colonic polyps ?CPT copyright 2019 [employer]. All rights reserved. ?The codes documented in this report are  preliminary and upon coder review may  ?be revised to meet current compliance requirements. ?Efrain Sella MD, MD ?08/25/2021 10:55:36 AM ?This report has been signed electronically. ?Number of Addenda: 0 ?Note Initiated On: 08/25/2021 10:32 AM ?Scope Withdrawal Time: 0 hours 8 minutes 34 seconds  ?Total Procedure Duration: 0 hours 11 minutes 11 seconds  ?Estimated Blood Loss:  Estimated blood loss: none. ?     [facility] ?

## 2021-08-25 NOTE — Anesthesia Preprocedure Evaluation (Signed)
Anesthesia Evaluation  ?Patient identified by MRN, date of birth, ID band ?Patient awake ? ? ? ?Reviewed: ?Allergy & Precautions, NPO status , Patient's Chart, lab work & pertinent test results ? ?History of Anesthesia Complications ?Negative for: history of anesthetic complications ? ?Airway ?Mallampati: III ? ?TM Distance: <3 FB ?Neck ROM: full ? ? ? Dental ? ?(+) Chipped ?  ?Pulmonary ?neg shortness of breath, former smoker,  ?  ?Pulmonary exam normal ? ? ? ? ? ? ? Cardiovascular ?hypertension, (-) angina(-) Past MI Normal cardiovascular exam ? ? ?  ?Neuro/Psych ?negative neurological ROS ? negative psych ROS  ? GI/Hepatic ?negative GI ROS, Neg liver ROS, neg GERD  ,  ?Endo/Other  ?negative endocrine ROS ? Renal/GU ?negative Renal ROS  ?negative genitourinary ?  ?Musculoskeletal ? ? Abdominal ?  ?Peds ? Hematology ?negative hematology ROS ?(+)   ?Anesthesia Other Findings ?Past Medical History: ?07/10/2014: History of basal cell carcinoma ?    Comment:  R medial canthus ?01/27/2016: History of dysplastic nevus ?    Comment:  left mid back ?06/22/2011: History of melanoma in situ ?    Comment:  right mid back ?No date: Hypertension ? ?Past Surgical History: ?No date: COLONOSCOPY ?No date: FRACTURE SURGERY ? ?BMI   ? Body Mass Index: 32.32 kg/m?  ?  ? ? Reproductive/Obstetrics ?negative OB ROS ? ?  ? ? ? ? ? ? ? ? ? ? ? ? ? ?  ?  ? ? ? ? ? ? ? ? ?Anesthesia Physical ?Anesthesia Plan ? ?ASA: 3 ? ?Anesthesia Plan: General  ? ?Post-op Pain Management:   ? ?Induction: Intravenous ? ?PONV Risk Score and Plan: Propofol infusion and TIVA ? ?Airway Management Planned: Natural Airway and Nasal Cannula ? ?Additional Equipment:  ? ?Intra-op Plan:  ? ?Post-operative Plan:  ? ?Informed Consent: I have reviewed the patients History and Physical, chart, labs and discussed the procedure including the risks, benefits and alternatives for the proposed anesthesia with the patient or authorized  representative who has indicated his/her understanding and acceptance.  ? ? ? ?Dental Advisory Given ? ?Plan Discussed with: Anesthesiologist, CRNA and Surgeon ? ?Anesthesia Plan Comments: (Patient consented for risks of anesthesia including but not limited to:  ?- adverse reactions to medications ?- risk of airway placement if required ?- damage to eyes, teeth, lips or other oral mucosa ?- nerve damage due to positioning  ?- sore throat or hoarseness ?- Damage to heart, brain, nerves, lungs, other parts of body or loss of life ? ?Patient voiced understanding.)  ? ? ? ? ? ? ?Anesthesia Quick Evaluation ? ?

## 2021-08-25 NOTE — H&P (Signed)
Outpatient short stay form Pre-procedure ?08/25/2021 9:38 AM ?James Coleman James Coleman, M.D. ? ?Primary Physician: Dimas Aguas, MD ? ?Reason for visit: Family history of colon cancer, personal history of colon polyps ? ?History of present illness: 66 year old male with a personal history of adenomatous colon polyps in 2017 also reports family history of colon cancer in his mother.Patient denies change in bowel habits, rectal bleeding, weight loss or abdominal pain.  ? ? ? ?No current facility-administered medications for this encounter. ? ?Medications Prior to Admission  ?Medication Sig Dispense Refill Last Dose  ? acetaminophen (TYLENOL) 500 MG tablet Take 500 mg by mouth every 6 (six) hours as needed.     ? busPIRone (BUSPAR) 15 MG tablet Take 15 mg by mouth 3 (three) times daily.     ? cetirizine (ZYRTEC) 10 MG tablet Take 10 mg by mouth daily.     ? clonazePAM (KLONOPIN) 0.5 MG tablet Take 0.5 mg by mouth 2 (two) times daily as needed for anxiety.     ? Cyanocobalamin (VITAMIN B 12 PO) Take by mouth.     ? cyclobenzaprine (FLEXERIL) 5 MG tablet Take 5 mg by mouth 3 (three) times daily as needed for muscle spasms.     ? losartan-hydrochlorothiazide (HYZAAR) 100-25 MG tablet Take 1 tablet by mouth daily.     ? meloxicam (MOBIC) 15 MG tablet Take 15 mg by mouth daily.     ? metoprolol succinate (TOPROL-XL) 25 MG 24 hr tablet Take 25 mg by mouth daily.     ? sildenafil (VIAGRA) 100 MG tablet Take 100 mg by mouth daily as needed for erectile dysfunction.     ? simvastatin (ZOCOR) 20 MG tablet Take 20 mg by mouth daily.     ? zolpidem (AMBIEN) 10 MG tablet Take 10 mg by mouth at bedtime as needed for sleep.     ? ? ? ?No Known Allergies ? ? ?Past Medical History:  ?Diagnosis Date  ? History of basal cell carcinoma 07/10/2014  ? R medial canthus  ? History of dysplastic nevus 01/27/2016  ? left mid back  ? History of melanoma in situ 06/22/2011  ? right mid back  ? Hypertension   ? ? ?Review of systems:  Otherwise negative.   ? ? ?Physical Exam ? ?Gen: Alert, oriented. Appears stated age.  ?HEENT: [place]/AT. PERRLA. ?Lungs: CTA, no wheezes. ?CV: RR nl S1, S2. ?Abd: soft, benign, no masses. BS+ ?Ext: No edema. Pulses 2+ ? ? ? ?Planned procedures: Proceed with colonoscopy. The patient understands the nature of the planned procedure, indications, risks, alternatives and potential complications including but not limited to bleeding, infection, perforation, damage to internal organs and possible oversedation/side effects from anesthesia. The patient agrees and gives consent to proceed.  ?Please refer to procedure notes for findings, recommendations and patient disposition/instructions.  ? ? ? ?[place] James Coleman, M.D. ?Gastroenterology ?08/25/2021  9:38 AM ? ? ? ? ? ? ?

## 2021-08-25 NOTE — Anesthesia Postprocedure Evaluation (Signed)
Anesthesia Post Note ? ?Patient: James Coleman ? ?Procedure(s) Performed: COLONOSCOPY WITH PROPOFOL ? ?Patient location during evaluation: Endoscopy ?Anesthesia Type: General ?Level of consciousness: awake and alert ?Pain management: pain level controlled ?Vital Signs Assessment: post-procedure vital signs reviewed and stable ?Respiratory status: spontaneous breathing, nonlabored ventilation, respiratory function stable and patient connected to nasal cannula oxygen ?Cardiovascular status: blood pressure returned to baseline and stable ?Postop Assessment: no apparent nausea or vomiting ?Anesthetic complications: no ? ? ?No notable events documented. ? ? ?Last Vitals:  ?Vitals:  ? 08/25/21 1110 08/25/21 1120  ?BP: (!) 137/92 (!) 151/91  ?Pulse: 74 80  ?Resp: 17 17  ?Temp:    ?SpO2: 96% 96%  ?  ?Last Pain:  ?Vitals:  ? 08/25/21 1050  ?TempSrc: Temporal  ?PainSc:   ? ? ?  ?  ?  ?  ?  ?  ? ?James Coleman ? ? ? ? ?

## 2021-08-25 NOTE — Anesthesia Procedure Notes (Signed)
Date/Time: 08/25/2021 10:33 AM ?Performed by: Lily Peer, Adalea Handler, CRNA ?Pre-anesthesia Checklist: Patient identified, Emergency Drugs available, Suction available, Patient being monitored and Timeout performed ?Patient Re-evaluated:Patient Re-evaluated prior to induction ?Oxygen Delivery Method: Nasal cannula ?Induction Type: IV induction ? ? ? ? ?

## 2021-08-25 NOTE — Interval H&P Note (Signed)
History and Physical Interval Note: ? ?08/25/2021 ?9:40 AM ? ?James Coleman  has presented today for surgery, with the diagnosis of personal history adenomatous polyp.  The various methods of treatment have been discussed with the patient and family. After consideration of risks, benefits and other options for treatment, the patient has consented to  Procedure(s): ?COLONOSCOPY WITH PROPOFOL (N/A) as a surgical intervention.  The patient's history has been reviewed, patient examined, no change in status, stable for surgery.  I have reviewed the patient's chart and labs.  Questions were answered to the patient's satisfaction.   ? ? ?[place], [place] ? ? ?

## 2021-08-26 ENCOUNTER — Encounter: Payer: Self-pay | Admitting: Internal Medicine

## 2021-08-26 LAB — SURGICAL PATHOLOGY

## 2021-11-16 ENCOUNTER — Other Ambulatory Visit: Payer: Self-pay | Admitting: Urology

## 2021-11-16 DIAGNOSIS — C61 Malignant neoplasm of prostate: Secondary | ICD-10-CM

## 2021-12-09 ENCOUNTER — Ambulatory Visit
Admission: RE | Admit: 2021-12-09 | Discharge: 2021-12-09 | Disposition: A | Discharge status: Home / Self Care | Payer: BC Managed Care – PPO | Source: Ambulatory Visit | Attending: Urology | Admitting: Urology

## 2021-12-09 DIAGNOSIS — C61 Malignant neoplasm of prostate: Secondary | ICD-10-CM

## 2021-12-09 IMAGING — MR MR PROSTATE WO/W CM
12 series · 48 of 48 positions shown · IV contrast (20ml Multihance)
Comparison: [DATE].

CLINICAL DATA: A 65-year-old male presents for evaluation of
elevated PSA to 7.05 with history of prior biopsy showing Gleason 6
disease.

EXAM:
MR PROSTATE WITHOUT AND WITH CONTRAST
TECHNIQUE: Multiplanar multisequence MRI images were obtained of the pelvis
centered about the prostate. Pre and post contrast images were
obtained.
CONTRAST:  20mL MULTIHANCE GADOBENATE DIMEGLUMINE 529 MG/ML IV SOLN

[Series 3: T2 · coronal · 3.0mm · 0.56mm/px · 1 of 23 slices shown (1 of 3)]
[im 1/23]
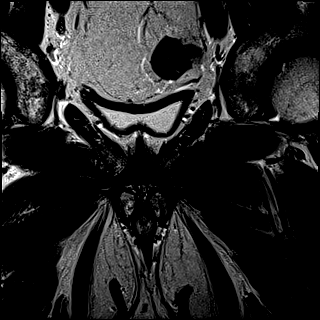

[Series 4: T1 · axial · 5.0mm · 1.25mm/px · 1 of 96 slices shown]
[im 1/96]
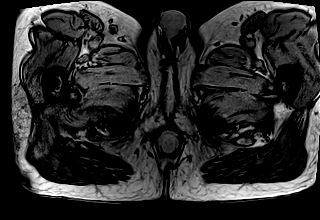

[Series 5: DWI · axial · 3.0mm · 1.75mm/px · 1 of 72 slices shown (1 of 3)]
[im 1/72]
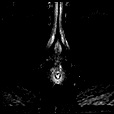

[Series 6: DWI · axial · 3.0mm · 1.75mm/px · 1 of 24 slices shown (2 of 3)]
[im 1/24]
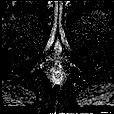

[Series 7: DWI · axial · 3.0mm · 1.75mm/px · 1 of 24 slices shown (3 of 3)]
[im 1/24]
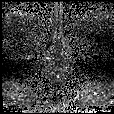

[Series 8: T2 · axial · 3.0mm · 0.56mm/px · 1 of 25 slices shown (2 of 3)]
[im 1/25]
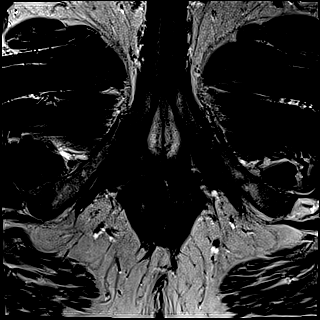

[Series 9: T2 · axial · 1.0mm · 1.04mm/px · z∈[-109,-38]mm · 2 of 72 slices shown (3 of 3)]
[im 1/72]
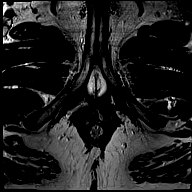
[im 72/72]
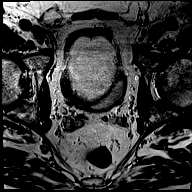

[Series 10: pre t1_twist_tra_dyn · axial · non-contrast · 3.5mm · 0.83mm/px · 1 of 24 slices shown]
[im 1/24]
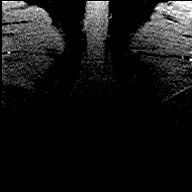

[Series 11: post t1_twist_tra_dyn-copy center · axial · non-contrast · 3.5mm · 0.83mm/px · z∈[-115,-34]mm · 18 of 720 slices shown]
[im 1/720]
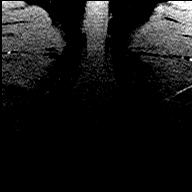
[im 43/720]
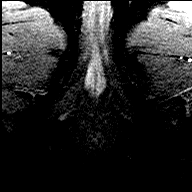
[im 85/720]
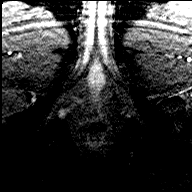
[im 127/720]
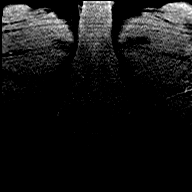
[im 170/720]
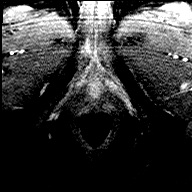
[im 212/720]
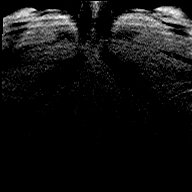
[im 254/720]
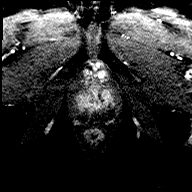
[im 297/720]
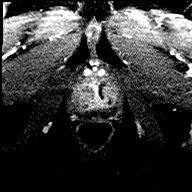
[im 339/720]
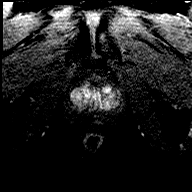
[im 381/720]
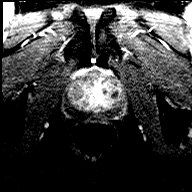
[im 423/720]
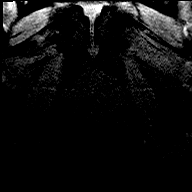
[im 466/720]
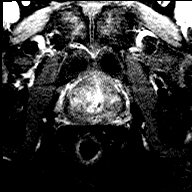
[im 508/720]
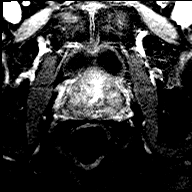
[im 550/720]
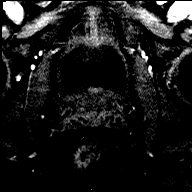
[im 593/720]
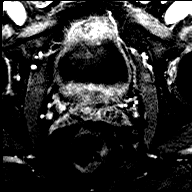
[im 635/720]
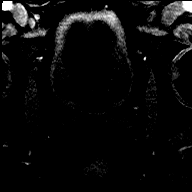
[im 677/720]
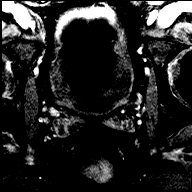
[im 720/720]
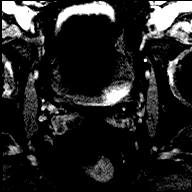

[Series 12: post t1_twist_tra_dyn-copy cent_sub · axial · 3.5mm · 0.83mm/px · z∈[-115,-34]mm · 17 of 690 slices shown]
[im 1/690]
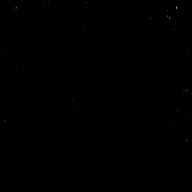
[im 44/690]
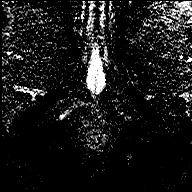
[im 87/690]
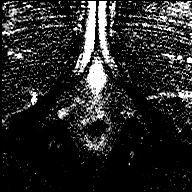
[im 130/690]
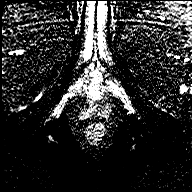
[im 173/690]
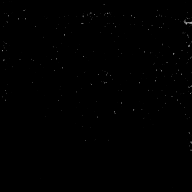
[im 216/690]
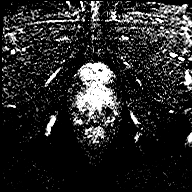
[im 259/690]
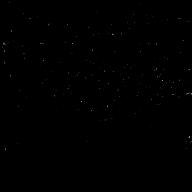
[im 302/690]
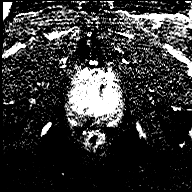
[im 345/690]
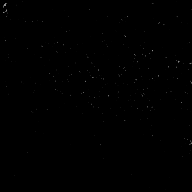
[im 388/690]
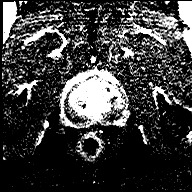
[im 431/690]
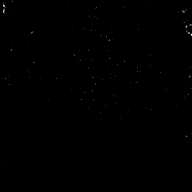
[im 474/690]
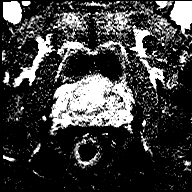
[im 517/690]
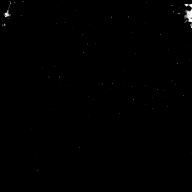
[im 560/690]
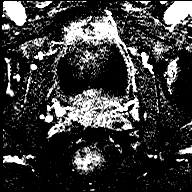
[im 603/690]
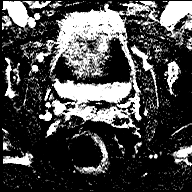
[im 646/690]
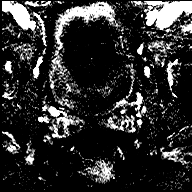
[im 690/690]
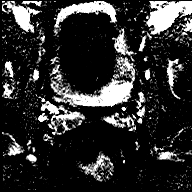

[Series 13: t1_vibe_dixon_tra_f · axial · 2.5mm · 0.91mm/px · z∈[-108,+109]mm · 2 of 88 slices shown]
[im 1/88]
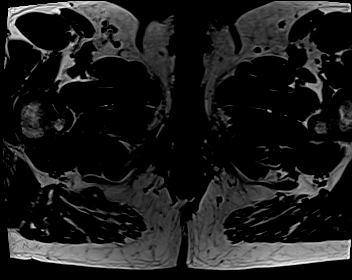
[im 88/88]
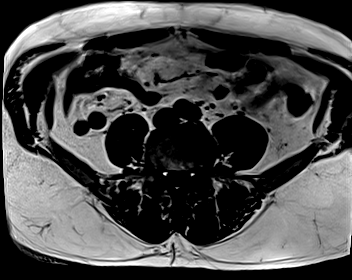

[Series 14: t1_vibe_dixon_tra_w · axial · 2.5mm · 0.91mm/px · z∈[-108,+109]mm · 2 of 88 slices shown]
[im 1/88]
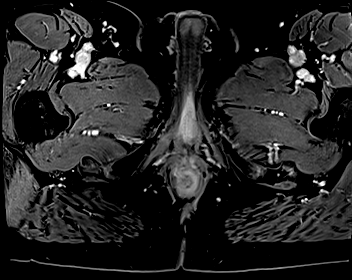
[im 88/88]
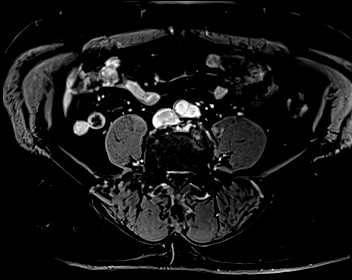

[48 of 48 positions shown; findings below may reference images not displayed]

FINDINGS: Prostate:

Transitional zone: Signs of BPH with heterogeneity and nodularity in
the transitional zone. No high-risk lesion.

Peripheral zone: Minimal heterogeneity without high-risk lesion.
Findings favored to represent sequela of mild prior prostatitis.

Volume: 53.8 cc

Transcapsular spread:  Absent

Seminal vesicle involvement: Absent

Neurovascular bundle involvement: Absent

Pelvic adenopathy: Absent

Bone metastasis: Absent

Other findings: Mild colonic diverticulosis.
IMPRESSION: No signs of high-risk lesion. Overall assessment PIRADS category 2,
with changes of BPH.

## 2021-12-09 MED ORDER — GADOBENATE DIMEGLUMINE 529 MG/ML IV SOLN
20.0000 mL | Freq: Once | INTRAVENOUS | Status: AC | PRN
  Administered 2021-12-09: 20 mL via INTRAVENOUS

## 2022-03-08 ENCOUNTER — Ambulatory Visit: Payer: BC Managed Care – PPO | Admitting: Dermatology

## 2022-04-03 ENCOUNTER — Other Ambulatory Visit: Payer: Self-pay

## 2022-04-03 ENCOUNTER — Emergency Department
Admission: EM | Admit: 2022-04-03 | Discharge: 2022-04-03 | Disposition: A | Discharge status: Home / Self Care | Payer: BC Managed Care – PPO | Attending: Student in an Organized Health Care Education/Training Program | Admitting: Student in an Organized Health Care Education/Training Program

## 2022-04-03 ENCOUNTER — Encounter: Payer: Self-pay | Admitting: Intensive Care

## 2022-04-03 DIAGNOSIS — W278XXA Contact with other nonpowered hand tool, initial encounter: Secondary | ICD-10-CM | POA: Insufficient documentation

## 2022-04-03 DIAGNOSIS — S6992XA Unspecified injury of left wrist, hand and finger(s), initial encounter: Secondary | ICD-10-CM | POA: Diagnosis present

## 2022-04-03 DIAGNOSIS — S61211A Laceration without foreign body of left index finger without damage to nail, initial encounter: Secondary | ICD-10-CM | POA: Insufficient documentation

## 2022-04-03 HISTORY — DX: Pure hypercholesterolemia, unspecified: E78.00

## 2022-04-03 MED ORDER — HYDROCODONE-ACETAMINOPHEN 5-325 MG PO TABS: 1.0000 | ORAL_TABLET | Freq: Four times a day (QID) | ORAL | 0 refills | Status: AC | PRN

## 2022-04-03 MED ORDER — CEPHALEXIN 500 MG PO CAPS: 500.0000 mg | ORAL_CAPSULE | Freq: Four times a day (QID) | ORAL | 0 refills | Status: AC

## 2022-04-03 MED ORDER — HYDROCODONE-ACETAMINOPHEN 5-325 MG PO TABS
1.0000 | ORAL_TABLET | ORAL | Status: AC
  Administered 2022-04-03: 1 via ORAL
  Filled 2022-04-03: qty 1

## 2022-04-03 NOTE — ED Triage Notes (Signed)
Patient presents with left hand, index finger. Cut finger with Hatch. Bleeding controlled at this time.

## 2022-04-03 NOTE — ED Provider Notes (Signed)
[facility] Provider Note   CSN: [id number] Arrival date & time: 04/03/22  1717     History  Chief Complaint  Patient presents with   Extremity Laceration    James Coleman is a 66 y.o. male.  Presents to the emergency department for evaluation of left index finger laceration.  Radial aspect of the left index finger was hit with a hatchet.  Pain is moderate.  Tetanus is up-to-date.  He has a subungual hematoma that he states he developed 3 weeks ago from blunt force trauma.  HPI     Home Medications Prior to Admission medications   Medication Sig Start Date End Date Taking? Authorizing Provider  cephALEXin (KEFLEX) 500 MG capsule Take 1 capsule (500 mg total) by mouth 4 (four) times daily for 7 days. 04/03/22 04/10/22 Yes Duanne Guess, PA-C  HYDROcodone-acetaminophen (NORCO) 5-325 MG tablet Take 1 tablet by mouth every 6 (six) hours as needed for moderate pain. 04/03/22  Yes Duanne Guess, PA-C  acetaminophen (TYLENOL) 500 MG tablet Take 500 mg by mouth every 6 (six) hours as needed.    [provider]  busPIRone (BUSPAR) 15 MG tablet Take 15 mg by mouth 3 (three) times daily.    [provider]  cetirizine (ZYRTEC) 10 MG tablet Take 10 mg by mouth daily.    [provider]  clonazePAM (KLONOPIN) 0.5 MG tablet Take 0.5 mg by mouth 2 (two) times daily as needed for anxiety.    [provider]  Cyanocobalamin (VITAMIN B 12 PO) Take by mouth.    [provider]  cyclobenzaprine (FLEXERIL) 5 MG tablet Take 5 mg by mouth 3 (three) times daily as needed for muscle spasms.    [provider]  losartan-hydrochlorothiazide (HYZAAR) 100-25 MG tablet Take 1 tablet by mouth daily.    [provider]  meloxicam (MOBIC) 15 MG tablet Take 15 mg by mouth daily.    [provider]  metoprolol succinate (TOPROL-XL) 25 MG 24 hr tablet Take 25 mg by mouth daily.    [provider]  sildenafil (VIAGRA) 100 MG tablet Take 100 mg by mouth daily as needed for erectile dysfunction.    [provider]  simvastatin (ZOCOR) 20 MG tablet Take 20 mg by mouth daily.    [provider]  zolpidem (AMBIEN) 10 MG tablet Take 10 mg by mouth at bedtime as needed for sleep.    [provider]      Allergies    Patient has no known allergies.    Review of Systems   Review of Systems  Physical Exam Updated Vital Signs BP (!) 140/86 (BP Location: Right Arm)   Pulse 74   Temp 97.8 F (36.6 C) (Oral)   Resp 16   Ht '6\' 1"'$  (1.854 m)   Wt 104.3 kg   SpO2 97%   BMI 30.34 kg/m  Physical Exam Vitals and nursing note reviewed.  Constitutional:      General: He is not in acute distress.    Appearance: He is well-developed.  HENT:     Head: Normocephalic and atraumatic.  Eyes:     Conjunctiva/sclera: Conjunctivae normal.  Cardiovascular:     Rate and Rhythm: Normal rate and regular rhythm.     Heart sounds: No murmur heard. Pulmonary:     Effort: Pulmonary effort is normal. No respiratory distress.     Breath sounds: Normal breath sounds.  Abdominal:  Palpations: Abdomen is soft.     Tenderness: There is no abdominal tenderness.  Musculoskeletal:        General: No swelling. Normal range of motion.     Cervical back: Neck supple.     Comments: Left index finger with normal active extension and flexion.  Subungual hematoma present, appears old, laceration along the radial aspect of the distal left index finger to the nail fold proximally.  Laceration is flap-like.  Bleeding well controlled.  Laceration overall appears to be superficial  Skin:    General: Skin is warm and dry.     Capillary Refill: Capillary refill takes less than 2 seconds.  Neurological:     General: No focal deficit present.     Mental Status: He is alert.  Psychiatric:        Mood and Affect: Mood normal.     ED Results / Procedures / Treatments    Labs (all labs ordered are listed, but only abnormal results are displayed) Labs Reviewed - No data to display  EKG None  Radiology No results found.  Procedures .Marland KitchenLaceration Repair  Date/Time: 04/03/2022 9:13 PM  Performed by: Duanne Guess, PA-C Authorized by: Duanne Guess, PA-C   Consent:    Consent obtained:  Verbal   Consent given by:  Patient Treatment:    Area cleansed with:  Povidone-iodine   Amount of cleaning:  Standard   Irrigation solution:  Tap water   Irrigation volume:  Soak   Debridement:  None Skin repair:    Repair method:  Tissue adhesive Approximation:    Approximation:  Close Repair type:    Repair type:  Simple Post-procedure details:    Dressing:  Adhesive bandage     Medications Ordered in ED Medications  HYDROcodone-acetaminophen (NORCO/VICODIN) 5-325 MG per tablet 1 tablet (has no administration in time range)    ED Course/ Medical Decision Making/ A&P                           Medical Decision Making Risk Prescription drug management.   Left index finger laceration with a hatchet.  Laceration superficial.  Laceration repaired with Dermabond after thorough cleansing and irrigation with Betadine.  Offered x-rays but patient not interested he feels adamant that he did not fracture anything that this is a soft tissue injury only.  He has a subungual hematoma present but this is chronic and appears chronic on exam and does not appear drainable.  He is placed on antibiotics and given Norco for pain.  He is educated on wound care.   Final Clinical Impression(s) / ED Diagnoses Final diagnoses:  Laceration of left index finger without foreign body without damage to nail, initial encounter    Rx / DC Orders ED Discharge Orders          Ordered    HYDROcodone-acetaminophen (NORCO) 5-325 MG tablet  Every 6 hours PRN        04/03/22 2102    cephALEXin (KEFLEX) 500 MG capsule  4 times daily        04/03/22 2102               Renata Caprice 04/03/22 2114    Merlyn Lot, MD 04/03/22 2220

## 2022-04-03 NOTE — ED Provider Triage Note (Signed)
  Emergency Medicine Provider Triage Evaluation Note  James Coleman , a 66 y.o.male,  was evaluated in triage.  Pt complains of laceration to the left index finger.  Patient states that he cut his finger with a hatchet.  Bleeding controlled at this time.  He states that he does not believe that he broke anything.  Does not want any x-rays at this time.   Review of Systems  Positive: Finger laceration Negative: Denies fever, chest pain, vomiting  Physical Exam   Vitals:   04/03/22 1732  BP: (!) 140/86  Pulse: 74  Resp: 16  Temp: 97.8 F (36.6 C)  SpO2: 97%   Gen:   Awake, no distress   Resp:  Normal effort  MSK:   Moves extremities without difficulty  Other:  1.5 cm laceration distal to the DIP joint on the left index finger.  No active bleeding.  Medical Decision Making  Given the patient's initial medical screening exam, the following diagnostic evaluation has been ordered. The patient will be placed in the appropriate treatment space, once one is available, to complete the evaluation and treatment. I have discussed the plan of care with the patient and I have advised the patient that an ED physician or mid-level practitioner will reevaluate their condition after the test results have been received, as the results may give them additional insight into the type of treatment they may need.    Diagnostics: None immediately  Treatments: none immediately   Teodoro Spray, [place] 04/03/22 1753

## 2022-04-03 NOTE — Discharge Instructions (Signed)
Please keep laceration site clean and covered.  Keep dry for 5 days.  In 5 days you can begin showering and get wet and allow Dermabond to come off on its own.  Take antibiotics as prescribed.  Take Tylenol as needed for mild to moderate pain.  If severe pain you may use Norco.

## 2022-04-13 ENCOUNTER — Encounter: Payer: Self-pay | Admitting: Dermatology

## 2022-04-13 ENCOUNTER — Ambulatory Visit: Payer: BC Managed Care – PPO | Admitting: Dermatology

## 2022-04-13 DIAGNOSIS — Z86006 Personal history of melanoma in-situ: Secondary | ICD-10-CM

## 2022-04-13 DIAGNOSIS — Z86018 Personal history of other benign neoplasm: Secondary | ICD-10-CM

## 2022-04-13 DIAGNOSIS — Z1283 Encounter for screening for malignant neoplasm of skin: Secondary | ICD-10-CM

## 2022-04-13 DIAGNOSIS — L570 Actinic keratosis: Secondary | ICD-10-CM | POA: Diagnosis not present

## 2022-04-13 DIAGNOSIS — Z85820 Personal history of malignant melanoma of skin: Secondary | ICD-10-CM

## 2022-04-13 DIAGNOSIS — L820 Inflamed seborrheic keratosis: Secondary | ICD-10-CM | POA: Diagnosis not present

## 2022-04-13 DIAGNOSIS — L821 Other seborrheic keratosis: Secondary | ICD-10-CM

## 2022-04-13 DIAGNOSIS — Z85828 Personal history of other malignant neoplasm of skin: Secondary | ICD-10-CM

## 2022-04-13 DIAGNOSIS — D229 Melanocytic nevi, unspecified: Secondary | ICD-10-CM

## 2022-04-13 DIAGNOSIS — L578 Other skin changes due to chronic exposure to nonionizing radiation: Secondary | ICD-10-CM

## 2022-04-13 DIAGNOSIS — L814 Other melanin hyperpigmentation: Secondary | ICD-10-CM

## 2022-04-13 NOTE — Patient Instructions (Signed)
Cryotherapy Aftercare  Wash gently with soap and water everyday.   Apply Vaseline and Band-Aid daily until healed.     Due to recent changes in healthcare laws, you may see results of your pathology and/or laboratory studies on MyChart before the doctors have had a chance to review them. We understand that in some cases there may be results that are confusing or concerning to you. Please understand that not all results are received at the same time and often the doctors may need to interpret multiple results in order to provide you with the best plan of care or course of treatment. Therefore, we ask that you please give us 2 business days to thoroughly review all your results before contacting the office for clarification. Should we see a critical lab result, you will be contacted sooner.   If You Need Anything After Your Visit  If you have any questions or concerns for your doctor, please call our main line at [phone] and press option 4 to reach your doctor's medical assistant. If no one answers, please leave a voicemail as directed and we will return your call as soon as possible. Messages left after 4 pm will be answered the following business day.   You may also send us a message via MyChart. We typically respond to MyChart messages within 1-2 business days.  For prescription refills, please ask your pharmacy to contact our office. Our fax number is [phone].  If you have an urgent issue when the clinic is closed that cannot wait until the next business day, you can page your doctor at the number below.    Please note that while we do our best to be available for urgent issues outside of office hours, we are not available 24/7.   If you have an urgent issue and are unable to reach us, you may choose to seek medical care at your doctor's office, retail clinic, urgent care center, or emergency room.  If you have a medical emergency, please immediately call 911 or go to the  emergency department.  Pager Numbers  - Dr. Kowalski: [phone]  - Dr. Moye: [phone]  - Dr. Stewart: [phone]  In the event of inclement weather, please call our main line at [phone] for an update on the status of any delays or closures.  Dermatology Medication Tips: Please keep the boxes that topical medications come in in order to help keep track of the instructions about where and how to use these. Pharmacies typically print the medication instructions only on the boxes and not directly on the medication tubes.   If your medication is too expensive, please contact our office at [phone] option 4 or send us a message through MyChart.   We are unable to tell what your co-pay for medications will be in advance as this is different depending on your insurance coverage. However, we may be able to find a substitute medication at lower cost or fill out paperwork to get insurance to cover a needed medication.   If a prior authorization is required to get your medication covered by your insurance company, please allow us 1-2 business days to complete this process.  Drug prices often vary depending on where the prescription is filled and some pharmacies may offer cheaper prices.  The website www.goodrx.com contains coupons for medications through different pharmacies. The prices here do not account for what the cost may be with help from insurance (it may be cheaper with your insurance), but the website can   give you the price if you did not use any insurance.  - You can print the associated coupon and take it with your prescription to the pharmacy.  - You may also stop by our office during regular business hours and pick up a GoodRx coupon card.  - If you need your prescription sent electronically to a different pharmacy, notify our office through [facility] MyChart or by phone at [phone] option 4.     Si Usted Necesita Algo Despus de Su Visita  Tambin puede  enviarnos un mensaje a travs de MyChart. Por lo general respondemos a los mensajes de MyChart en el transcurso de 1 a 2 das hbiles.  Para renovar recetas, por favor pida a su farmacia que se ponga en contacto con nuestra oficina. Nuestro nmero de fax es el [phone].  Si tiene un asunto urgente cuando la clnica est cerrada y que no puede esperar hasta el siguiente da hbil, puede llamar/localizar a su doctor(a) al nmero que aparece a continuacin.   Por favor, tenga en cuenta que aunque hacemos todo lo posible para estar disponibles para asuntos urgentes fuera del horario de oficina, no estamos disponibles las 24 horas del da, los 7 das de la semana.   Si tiene un problema urgente y no puede comunicarse con nosotros, puede optar por buscar atencin mdica  en el consultorio de su doctor(a), en una clnica privada, en un centro de atencin urgente o en una sala de emergencias.  Si tiene una emergencia mdica, por favor llame inmediatamente al 911 o vaya a la sala de emergencias.  Nmeros de bper  - Dr. Kowalski: [phone]  - Dra. Moye: [phone]  - Dra. Stewart: [phone]  En caso de inclemencias del tiempo, por favor llame a nuestra lnea principal al [phone] para una actualizacin sobre el estado de cualquier retraso o cierre.  Consejos para la medicacin en dermatologa: Por favor, guarde las cajas en las que vienen los medicamentos de uso tpico para ayudarle a seguir las instrucciones sobre dnde y cmo usarlos. Las farmacias generalmente imprimen las instrucciones del medicamento slo en las cajas y no directamente en los tubos del medicamento.   Si su medicamento es muy caro, por favor, pngase en contacto con nuestra oficina llamando al [phone] y presione la opcin 4 o envenos un mensaje a travs de MyChart.   No podemos decirle cul ser su copago por los medicamentos por adelantado ya que esto es diferente dependiendo de la cobertura de su seguro.  Sin embargo, es posible que podamos encontrar un medicamento sustituto a menor costo o llenar un formulario para que el seguro cubra el medicamento que se considera necesario.   Si se requiere una autorizacin previa para que su compaa de seguros cubra su medicamento, por favor permtanos de 1 a 2 das hbiles para completar este proceso.  Los precios de los medicamentos varan con frecuencia dependiendo del lugar de dnde se surte la receta y alguna farmacias pueden ofrecer precios ms baratos.  El sitio web www.goodrx.com tiene cupones para medicamentos de diferentes farmacias. Los precios aqu no tienen en cuenta lo que podra costar con la ayuda del seguro (puede ser ms barato con su seguro), pero el sitio web puede darle el precio si no utiliz ningn seguro.  - Puede imprimir el cupn correspondiente y llevarlo con su receta a la farmacia.  - Tambin puede pasar por nuestra oficina durante el horario de atencin regular y recoger una tarjeta de cupones de GoodRx.  -   Si necesita que su receta se enve electrnicamente a una farmacia diferente, informe a nuestra oficina a travs de MyChart de [facility] o por telfono llamando al [phone] y presione la opcin 4.  

## 2022-04-13 NOTE — Progress Notes (Signed)
Follow-Up Visit   Subjective  James Coleman is a 66 y.o. male who presents for the following: Annual Exam (History of Melanoma in situ - The patient presents for Total-Body Skin Exam (TBSE) for skin cancer screening and mole check.  The patient has spots, moles and lesions to be evaluated, some may be new or changing and the patient has concerns that these could be cancer./).  The following portions of the chart were reviewed this encounter and updated as appropriate:   Tobacco  Allergies  Meds  Problems  Med Hx  Surg Hx  Fam Hx     Review of Systems:  No other skin or systemic complaints except as noted in HPI or Assessment and Plan.  Objective  Well appearing patient in no apparent distress; mood and affect are within normal limits.  A full examination was performed including scalp, head, eyes, ears, nose, lips, neck, chest, axillae, abdomen, back, buttocks, bilateral upper extremities, bilateral lower extremities, hands, feet, fingers, toes, fingernails, and toenails. All findings within normal limits unless otherwise noted below.  Left Ear x 1, right cheek x 1 (2) Erythematous thin papules/macules with gritty scale.   Left Preauricular Area Erythematous stuck-on, waxy papule or plaque   Assessment & Plan   History of Melanoma in Situ - No evidence of recurrence today - Recommend regular full body skin exams - Recommend daily broad spectrum sunscreen SPF 30+ to sun-exposed areas, reapply every 2 hours as needed.  - Call if any new or changing lesions are noted between office visits -No lymphadenopathy  History of Basal Cell Carcinoma of the Skin - No evidence of recurrence today - Recommend regular full body skin exams - Recommend daily broad spectrum sunscreen SPF 30+ to sun-exposed areas, reapply every 2 hours as needed.  - Call if any new or changing lesions are noted between office visits  History of Dysplastic Nevi - No evidence of recurrence today -  Recommend regular full body skin exams - Recommend daily broad spectrum sunscreen SPF 30+ to sun-exposed areas, reapply every 2 hours as needed.  - Call if any new or changing lesions are noted between office visits  Lentigines - Scattered tan macules - Due to sun exposure - Benign-appearing, observe - Recommend daily broad spectrum sunscreen SPF 30+ to sun-exposed areas, reapply every 2 hours as needed. - Call for any changes  Seborrheic Keratoses - Stuck-on, waxy, tan-brown papules and/or plaques  - Benign-appearing - Discussed benign etiology and prognosis. - Observe - Call for any changes  Melanocytic Nevi - Tan-brown and/or pink-flesh-colored symmetric macules and papules - Benign appearing on exam today - Observation - Call clinic for new or changing moles - Recommend daily use of broad spectrum spf 30+ sunscreen to sun-exposed areas.   Hemangiomas - Red papules - Discussed benign nature - Observe - Call for any changes  Actinic Damage - Chronic condition, secondary to cumulative UV/sun exposure - diffuse scaly erythematous macules with underlying dyspigmentation - Recommend daily broad spectrum sunscreen SPF 30+ to sun-exposed areas, reapply every 2 hours as needed.  - Staying in the shade or wearing long sleeves, sun glasses (UVA+UVB protection) and wide brim hats (4-inch brim around the entire circumference of the hat) are also recommended for sun protection.  - Call for new or changing lesions.  Skin cancer screening performed today.  AK (actinic keratosis) (2) Left Ear x 1, right cheek x 1  Destruction of lesion - Left Ear x 1, right cheek x 1  Complexity: simple   Destruction method: cryotherapy   Informed consent: discussed and consent obtained   Timeout:  patient name, date of birth, surgical site, and procedure verified Lesion destroyed using liquid nitrogen: Yes   Region frozen until ice ball extended beyond lesion: Yes   Outcome: patient tolerated  procedure well with no complications   Post-procedure details: wound care instructions given    Inflamed seborrheic keratosis Left Preauricular Area  Destruction of lesion - Left Preauricular Area Complexity: simple   Destruction method: cryotherapy   Informed consent: discussed and consent obtained   Timeout:  patient name, date of birth, surgical site, and procedure verified Lesion destroyed using liquid nitrogen: Yes   Region frozen until ice ball extended beyond lesion: Yes   Outcome: patient tolerated procedure well with no complications   Post-procedure details: wound care instructions given     Return in about 6 months (around 10/13/2022) for UBSE.  I, Ashok Cordia, CMA, am acting as scribe for Sarina Ser, MD . Documentation: I have reviewed the above documentation for accuracy and completeness, and I agree with the above.  Sarina Ser, MD

## 2022-04-18 ENCOUNTER — Encounter: Payer: Self-pay | Admitting: Dermatology

## 2022-08-18 ENCOUNTER — Ambulatory Visit: Payer: BC Managed Care – PPO | Admitting: Podiatry

## 2022-08-25 ENCOUNTER — Ambulatory Visit (INDEPENDENT_AMBULATORY_CARE_PROVIDER_SITE_OTHER): Payer: BC Managed Care – PPO

## 2022-08-25 ENCOUNTER — Ambulatory Visit: Payer: BC Managed Care – PPO | Admitting: Podiatry

## 2022-08-25 DIAGNOSIS — M2022 Hallux rigidus, left foot: Secondary | ICD-10-CM

## 2022-08-25 DIAGNOSIS — M069 Rheumatoid arthritis, unspecified: Secondary | ICD-10-CM

## 2022-08-25 DIAGNOSIS — M2021 Hallux rigidus, right foot: Secondary | ICD-10-CM | POA: Diagnosis not present

## 2022-08-25 DIAGNOSIS — L600 Ingrowing nail: Secondary | ICD-10-CM

## 2022-08-25 MED ORDER — CEPHALEXIN 500 MG PO CAPS: 500.0000 mg | ORAL_CAPSULE | Freq: Three times a day (TID) | ORAL | 0 refills | Status: AC

## 2022-08-25 NOTE — Progress Notes (Signed)
Subjective:   Patient ID: James Coleman, male   DOB: 67 y.o.   MRN: [id number]   HPI Chief Complaint  Patient presents with   Ingrown Toenail    Right foot, great hallux,     Ingrown toenail 1 month- his PCP put him on abx but it is still red. He thinks there may be a little pus.  No injuries to report.  No red streaks.  Bunion 15 years- at times the shoes get tight.  No injuries.  No other treatment.  Review of Systems  All other systems reviewed and are negative.  Past Medical History:  Diagnosis Date   High cholesterol    History of basal cell carcinoma 07/10/2014   R medial canthus   History of basal cell carcinoma    chin   History of dysplastic nevus 01/27/2016   left mid back   History of melanoma in situ 06/22/2011   right mid back   Hypertension     Past Surgical History:  Procedure Laterality Date   COLONOSCOPY     COLONOSCOPY WITH PROPOFOL N/A 08/25/2021   Procedure: COLONOSCOPY WITH PROPOFOL;  Surgeon: Toledo, Benay Pike, MD;  Location: [facility] ENDOSCOPY;  Service: Gastroenterology;  Laterality: N/A;   FRACTURE SURGERY       Current Outpatient Medications:    acetaminophen (TYLENOL) 500 MG tablet, Take 500 mg by mouth every 6 (six) hours as needed., Disp: , Rfl:    busPIRone (BUSPAR) 15 MG tablet, Take 15 mg by mouth 3 (three) times daily., Disp: , Rfl:    cephALEXin (KEFLEX) 500 MG capsule, Take 1 capsule (500 mg total) by mouth 3 (three) times daily., Disp: 21 capsule, Rfl: 0   cetirizine (ZYRTEC) 10 MG tablet, Take 10 mg by mouth daily., Disp: , Rfl:    clonazePAM (KLONOPIN) 0.5 MG tablet, Take 0.5 mg by mouth 2 (two) times daily as needed for anxiety., Disp: , Rfl:    Cyanocobalamin (VITAMIN B 12 PO), Take by mouth., Disp: , Rfl:    cyclobenzaprine (FLEXERIL) 5 MG tablet, Take 5 mg by mouth 3 (three) times daily as needed for muscle spasms., Disp: , Rfl:    HYDROcodone-acetaminophen (NORCO) 5-325 MG tablet, Take 1 tablet by mouth every 6 (six) hours as  needed for moderate pain., Disp: 10 tablet, Rfl: 0   losartan-hydrochlorothiazide (HYZAAR) 100-25 MG tablet, Take 1 tablet by mouth daily., Disp: , Rfl:    meloxicam (MOBIC) 15 MG tablet, Take 15 mg by mouth daily., Disp: , Rfl:    metoprolol succinate (TOPROL-XL) 25 MG 24 hr tablet, Take 25 mg by mouth daily., Disp: , Rfl:    sildenafil (VIAGRA) 100 MG tablet, Take 100 mg by mouth daily as needed for erectile dysfunction., Disp: , Rfl:    simvastatin (ZOCOR) 20 MG tablet, Take 20 mg by mouth daily., Disp: , Rfl:    zolpidem (AMBIEN) 10 MG tablet, Take 10 mg by mouth at bedtime as needed for sleep., Disp: , Rfl:   Allergies  Allergen Reactions   Codeine Other (See Comments)    Makes patient feel queasy            Objective:  Physical Exam  General: AAO x3, NAD  Dermatological: There is a problem the right lateral hallux nail border localized erythema without any drainage or pus or ascending cellulitis.  There is no fluctuance or crepitation.  There is no malodor.  Vascular: Dorsalis Pedis artery and Posterior Tibial artery pedal pulses are 2/4 bilateral  with immedate capillary fill time.  There is no pain with calf compression, swelling, warmth, erythema.   Neruologic: Grossly intact via light touch bilateral.   Musculoskeletal: Decreased range of motion of first MPJ with prominence on the dorsal first MPJ.  Localized edema without any significant erythema or warmth.  Minimal discomfort on the MPJ.  Gait: Unassisted, Nonantalgic.       Assessment:   67 year old male with ingrown toenail right lateral nail border; hallux rigidus     Plan:  -Treatment options discussed including all alternatives, risks, and complications -Etiology of symptoms were discussed -At this time, the patient is requesting partial nail removal with chemical matricectomy to the symptomatic portion of the nail. Risks and complications were discussed with the patient for which they understand and written  consent was obtained. Under sterile conditions a total of 3 mL of a mixture of 2% lidocaine plain and 0.5% Marcaine plain was infiltrated in a hallux block fashion. Once anesthetized, the skin was prepped in sterile fashion. A tourniquet was then applied. Next the lateral aspect of hallux nail border was then sharply excised making sure to remove the entire offending nail border. Once the nails were ensured to be removed area was debrided and the underlying skin was intact. There is no purulence identified in the procedure. Next phenol was then applied under standard conditions and copiously irrigated.  Silvadene was applied. A dry sterile dressing was applied. After application of the dressing the tourniquet was removed and there is found to be an immediate capillary refill time to the digit. The patient tolerated the procedure well any complications. Post procedure instructions were discussed the patient for which he verbally understood. Discussed signs/symptoms of infection and directed to call the office immediately should any occur or go directly to the emergency room. In the meantime, encouraged to call the office with any questions, concerns, changes symptoms. -Discussed shoe modifications, stiffer soled shoes to help with the first MPJ.  Consider injections or surgical intervention if needed as well in future. -Keflex  Trula Slade DPM

## 2022-08-25 NOTE — Patient Instructions (Signed)

## 2022-09-09 ENCOUNTER — Ambulatory Visit: Payer: BC Managed Care – PPO | Admitting: *Deleted

## 2022-09-09 DIAGNOSIS — L600 Ingrowing nail: Secondary | ICD-10-CM

## 2022-09-09 NOTE — Progress Notes (Signed)
Patient presents today for nail check of the hallux right - lateral border. Ingrown toenail procedure was performed on 08/25/2022 by Dr. Jacqualyn Posey.  Patient states feeling better. Initially he said was looking red, but the redness has gotten better. He is soaking and not covering. He also has one dose of antibiotic left.    Well healing toe s/p matrixectomy right hallux. There is no drainage or signs of infection.   Advised patient that he could discontinue soaks, but to loosen the scab, keep covered with a little triple antibiotic ointment and bandaid during the day and uncovered at night.  Patient will follow up as needed.

## 2022-10-20 ENCOUNTER — Ambulatory Visit: Payer: BC Managed Care – PPO | Admitting: Dermatology

## 2022-10-20 VITALS — BP 124/78

## 2022-10-20 DIAGNOSIS — Z85828 Personal history of other malignant neoplasm of skin: Secondary | ICD-10-CM

## 2022-10-20 DIAGNOSIS — L821 Other seborrheic keratosis: Secondary | ICD-10-CM | POA: Diagnosis not present

## 2022-10-20 DIAGNOSIS — L578 Other skin changes due to chronic exposure to nonionizing radiation: Secondary | ICD-10-CM

## 2022-10-20 DIAGNOSIS — Z872 Personal history of diseases of the skin and subcutaneous tissue: Secondary | ICD-10-CM

## 2022-10-20 DIAGNOSIS — L814 Other melanin hyperpigmentation: Secondary | ICD-10-CM

## 2022-10-20 DIAGNOSIS — L820 Inflamed seborrheic keratosis: Secondary | ICD-10-CM

## 2022-10-20 DIAGNOSIS — Z86006 Personal history of melanoma in-situ: Secondary | ICD-10-CM

## 2022-10-20 DIAGNOSIS — D1801 Hemangioma of skin and subcutaneous tissue: Secondary | ICD-10-CM

## 2022-10-20 DIAGNOSIS — Z1283 Encounter for screening for malignant neoplasm of skin: Secondary | ICD-10-CM

## 2022-10-20 DIAGNOSIS — Z86018 Personal history of other benign neoplasm: Secondary | ICD-10-CM

## 2022-10-20 DIAGNOSIS — X32XXXA Exposure to sunlight, initial encounter: Secondary | ICD-10-CM

## 2022-10-20 DIAGNOSIS — Z85820 Personal history of malignant melanoma of skin: Secondary | ICD-10-CM

## 2022-10-20 DIAGNOSIS — W908XXA Exposure to other nonionizing radiation, initial encounter: Secondary | ICD-10-CM

## 2022-10-20 NOTE — Patient Instructions (Addendum)
Cryotherapy Aftercare  Wash gently with soap and water everyday.   Apply Vaseline and Band-Aid daily until healed.     Due to recent changes in healthcare laws, you may see results of your pathology and/or laboratory studies on MyChart before the doctors have had a chance to review them. We understand that in some cases there may be results that are confusing or concerning to you. Please understand that not all results are received at the same time and often the doctors may need to interpret multiple results in order to provide you with the best plan of care or course of treatment. Therefore, we ask that you please give us 2 business days to thoroughly review all your results before contacting the office for clarification. Should we see a critical lab result, you will be contacted sooner.   If You Need Anything After Your Visit  If you have any questions or concerns for your doctor, please call our main line at [phone] and press option 4 to reach your doctor's medical assistant. If no one answers, please leave a voicemail as directed and we will return your call as soon as possible. Messages left after 4 pm will be answered the following business day.   You may also send us a message via MyChart. We typically respond to MyChart messages within 1-2 business days.  For prescription refills, please ask your pharmacy to contact our office. Our fax number is [phone].  If you have an urgent issue when the clinic is closed that cannot wait until the next business day, you can page your doctor at the number below.    Please note that while we do our best to be available for urgent issues outside of office hours, we are not available 24/7.   If you have an urgent issue and are unable to reach us, you may choose to seek medical care at your doctor's office, retail clinic, urgent care center, or emergency room.  If you have a medical emergency, please immediately call 911 or go to the  emergency department.  Pager Numbers  - Dr. Kowalski: [phone]  - Dr. Moye: [phone]  - Dr. Stewart: [phone]  In the event of inclement weather, please call our main line at [phone] for an update on the status of any delays or closures.  Dermatology Medication Tips: Please keep the boxes that topical medications come in in order to help keep track of the instructions about where and how to use these. Pharmacies typically print the medication instructions only on the boxes and not directly on the medication tubes.   If your medication is too expensive, please contact our office at [phone] option 4 or send us a message through MyChart.   We are unable to tell what your co-pay for medications will be in advance as this is different depending on your insurance coverage. However, we may be able to find a substitute medication at lower cost or fill out paperwork to get insurance to cover a needed medication.   If a prior authorization is required to get your medication covered by your insurance company, please allow us 1-2 business days to complete this process.  Drug prices often vary depending on where the prescription is filled and some pharmacies may offer cheaper prices.  The website www.goodrx.com contains coupons for medications through different pharmacies. The prices here do not account for what the cost may be with help from insurance (it may be cheaper with your insurance), but the website can   give you the price if you did not use any insurance.  - You can print the associated coupon and take it with your prescription to the pharmacy.  - You may also stop by our office during regular business hours and pick up a GoodRx coupon card.  - If you need your prescription sent electronically to a different pharmacy, notify our office through [facility] MyChart or by phone at [phone] option 4.     Si Usted Necesita Algo Despus de Su Visita  Tambin puede  enviarnos un mensaje a travs de MyChart. Por lo general respondemos a los mensajes de MyChart en el transcurso de 1 a 2 das hbiles.  Para renovar recetas, por favor pida a su farmacia que se ponga en contacto con nuestra oficina. Nuestro nmero de fax es el [phone].  Si tiene un asunto urgente cuando la clnica est cerrada y que no puede esperar hasta el siguiente da hbil, puede llamar/localizar a su doctor(a) al nmero que aparece a continuacin.   Por favor, tenga en cuenta que aunque hacemos todo lo posible para estar disponibles para asuntos urgentes fuera del horario de oficina, no estamos disponibles las 24 horas del da, los 7 das de la semana.   Si tiene un problema urgente y no puede comunicarse con nosotros, puede optar por buscar atencin mdica  en el consultorio de su doctor(a), en una clnica privada, en un centro de atencin urgente o en una sala de emergencias.  Si tiene una emergencia mdica, por favor llame inmediatamente al 911 o vaya a la sala de emergencias.  Nmeros de bper  - Dr. Kowalski: [phone]  - Dra. Moye: [phone]  - Dra. Stewart: [phone]  En caso de inclemencias del tiempo, por favor llame a nuestra lnea principal al [phone] para una actualizacin sobre el estado de cualquier retraso o cierre.  Consejos para la medicacin en dermatologa: Por favor, guarde las cajas en las que vienen los medicamentos de uso tpico para ayudarle a seguir las instrucciones sobre dnde y cmo usarlos. Las farmacias generalmente imprimen las instrucciones del medicamento slo en las cajas y no directamente en los tubos del medicamento.   Si su medicamento es muy caro, por favor, pngase en contacto con nuestra oficina llamando al [phone] y presione la opcin 4 o envenos un mensaje a travs de MyChart.   No podemos decirle cul ser su copago por los medicamentos por adelantado ya que esto es diferente dependiendo de la cobertura de su seguro.  Sin embargo, es posible que podamos encontrar un medicamento sustituto a menor costo o llenar un formulario para que el seguro cubra el medicamento que se considera necesario.   Si se requiere una autorizacin previa para que su compaa de seguros cubra su medicamento, por favor permtanos de 1 a 2 das hbiles para completar este proceso.  Los precios de los medicamentos varan con frecuencia dependiendo del lugar de dnde se surte la receta y alguna farmacias pueden ofrecer precios ms baratos.  El sitio web www.goodrx.com tiene cupones para medicamentos de diferentes farmacias. Los precios aqu no tienen en cuenta lo que podra costar con la ayuda del seguro (puede ser ms barato con su seguro), pero el sitio web puede darle el precio si no utiliz ningn seguro.  - Puede imprimir el cupn correspondiente y llevarlo con su receta a la farmacia.  - Tambin puede pasar por nuestra oficina durante el horario de atencin regular y recoger una tarjeta de cupones de GoodRx.  -   Si necesita que su receta se enve electrnicamente a una farmacia diferente, informe a nuestra oficina a travs de MyChart de [facility] o por telfono llamando al [phone] y presione la opcin 4.  

## 2022-10-20 NOTE — Progress Notes (Signed)
Follow-Up Visit   Subjective  James Coleman is a 67 y.o. male who presents for the following: Skin Cancer Screening and Upper Body Skin Exam hx of Melanoma IS, Dysplastic Nevus, BCC, AKs  The patient presents for Upper Body Skin Exam (UBSE) for skin cancer screening and mole check. The patient has spots, moles and lesions to be evaluated, some may be new or changing and the patient has concerns that these could be cancer.    The following portions of the chart were reviewed this encounter and updated as appropriate: medications, allergies, medical history  Review of Systems:  No other skin or systemic complaints except as noted in HPI or Assessment and Plan.  Objective  Well appearing patient in no apparent distress; mood and affect are within normal limits.  All skin waist up examined. Relevant physical exam findings are noted in the Assessment and Plan.  Right Lower Back x 1 Stuck on waxy paps with erythema    Assessment & Plan   Inflamed seborrheic keratosis Right Lower Back x 1  Symptomatic, irritating, patient would like treated.   Destruction of lesion - Right Lower Back x 1 Complexity: simple   Destruction method: cryotherapy   Informed consent: discussed and consent obtained   Timeout:  patient name, date of birth, surgical site, and procedure verified Lesion destroyed using liquid nitrogen: Yes   Region frozen until ice ball extended beyond lesion: Yes   Outcome: patient tolerated procedure well with no complications   Post-procedure details: wound care instructions given     Lentigines, Seborrheic Keratoses, Hemangiomas - Benign normal skin lesions - Benign-appearing - Call for any changes  Melanocytic Nevi - Tan-brown and/or pink-flesh-colored symmetric macules and papules - Benign appearing on exam today - Observation - Call clinic for new or changing moles - Recommend daily use of broad spectrum spf 30+ sunscreen to sun-exposed areas.   Actinic  Damage - Chronic condition, secondary to cumulative UV/sun exposure - diffuse scaly erythematous macules with underlying dyspigmentation - Recommend daily broad spectrum sunscreen SPF 30+ to sun-exposed areas, reapply every 2 hours as needed.  - Staying in the shade or wearing long sleeves, sun glasses (UVA+UVB protection) and wide brim hats (4-inch brim around the entire circumference of the hat) are also recommended for sun protection.  - Call for new or changing lesions.  HISTORY OF MELANOMA IN SITU - No evidence of recurrence today - No lymphadenopathy - Recommend regular full body skin exams - Recommend daily broad spectrum sunscreen SPF 30+ to sun-exposed areas, reapply every 2 hours as needed.  - Call if any new or changing lesions are noted between office visits  - R mid back, excised 2013  HISTORY OF BASAL CELL CARCINOMA OF THE SKIN - No evidence of recurrence today - Recommend regular full body skin exams - Recommend daily broad spectrum sunscreen SPF 30+ to sun-exposed areas, reapply every 2 hours as needed.  - Call if any new or changing lesions are noted between office visits  - R medial canthus  HISTORY OF DYSPLASTIC NEVUS No evidence of recurrence today Recommend regular full body skin exams Recommend daily broad spectrum sunscreen SPF 30+ to sun-exposed areas, reapply every 2 hours as needed.  Call if any new or changing lesions are noted between office visits  - L mid back  Skin cancer screening performed today.  HISTORY OF PRECANCEROUS ACTINIC KERATOSIS - site(s) of PreCancerous Actinic Keratosis clear today. - these may recur and new lesions may form  requiring treatment to prevent transformation into skin cancer - observe for new or changing spots and contact [facility] for appointment if occur - photoprotection with sun protective clothing; sunglasses and broad spectrum sunscreen with SPF of at least 30 + and frequent self skin exams recommended -  yearly exams by a dermatologist recommended for persons with history of PreCancerous Actinic Keratoses   Return in about 6 months (around 04/22/2023) for TBSE, Hx of Melanoma IS, Hx of BCC, Hx of Dysplastic nevi, Hx of AKs.  I, Ardis Rowan, RMA, am acting as scribe for Armida Sans, MD .   Documentation: I have reviewed the above documentation for accuracy and completeness, and I agree with the above.  Armida Sans, MD

## 2022-10-27 ENCOUNTER — Encounter: Payer: Self-pay | Admitting: Dermatology

## 2023-04-26 ENCOUNTER — Ambulatory Visit: Payer: BC Managed Care – PPO | Admitting: Dermatology

## 2023-08-23 ENCOUNTER — Ambulatory Visit: Payer: BC Managed Care – PPO | Admitting: Dermatology

## 2023-09-12 ENCOUNTER — Ambulatory Visit (INDEPENDENT_AMBULATORY_CARE_PROVIDER_SITE_OTHER): Payer: BC Managed Care – PPO | Admitting: Dermatology

## 2023-09-12 ENCOUNTER — Encounter: Payer: Self-pay | Admitting: Dermatology

## 2023-09-12 DIAGNOSIS — L821 Other seborrheic keratosis: Secondary | ICD-10-CM

## 2023-09-12 DIAGNOSIS — L570 Actinic keratosis: Secondary | ICD-10-CM

## 2023-09-12 DIAGNOSIS — L578 Other skin changes due to chronic exposure to nonionizing radiation: Secondary | ICD-10-CM

## 2023-09-12 DIAGNOSIS — L814 Other melanin hyperpigmentation: Secondary | ICD-10-CM | POA: Diagnosis not present

## 2023-09-12 DIAGNOSIS — Z1283 Encounter for screening for malignant neoplasm of skin: Secondary | ICD-10-CM | POA: Diagnosis not present

## 2023-09-12 DIAGNOSIS — W908XXA Exposure to other nonionizing radiation, initial encounter: Secondary | ICD-10-CM | POA: Diagnosis not present

## 2023-09-12 DIAGNOSIS — Z86018 Personal history of other benign neoplasm: Secondary | ICD-10-CM

## 2023-09-12 DIAGNOSIS — D1801 Hemangioma of skin and subcutaneous tissue: Secondary | ICD-10-CM

## 2023-09-12 DIAGNOSIS — L820 Inflamed seborrheic keratosis: Secondary | ICD-10-CM | POA: Diagnosis not present

## 2023-09-12 DIAGNOSIS — Z7189 Other specified counseling: Secondary | ICD-10-CM

## 2023-09-12 DIAGNOSIS — Z86006 Personal history of melanoma in-situ: Secondary | ICD-10-CM

## 2023-09-12 DIAGNOSIS — L720 Epidermal cyst: Secondary | ICD-10-CM

## 2023-09-12 DIAGNOSIS — D229 Melanocytic nevi, unspecified: Secondary | ICD-10-CM

## 2023-09-12 DIAGNOSIS — Z85820 Personal history of malignant melanoma of skin: Secondary | ICD-10-CM

## 2023-09-12 DIAGNOSIS — Z85828 Personal history of other malignant neoplasm of skin: Secondary | ICD-10-CM

## 2023-09-12 DIAGNOSIS — L719 Rosacea, unspecified: Secondary | ICD-10-CM

## 2023-09-12 NOTE — Patient Instructions (Addendum)

## 2023-09-12 NOTE — Progress Notes (Signed)
 Follow-Up Visit   Subjective  James Coleman is a 68 y.o. male who presents for the following: Skin Cancer Screening and Full Body Skin Exam Itchy dry skin at lower back   The patient presents for Total-Body Skin Exam (TBSE) for skin cancer screening and mole check. The patient has spots, moles and lesions to be evaluated, some may be new or changing and the patient may have concern these could be cancer.  The following portions of the chart were reviewed this encounter and updated as appropriate: medications, allergies, medical history  Review of Systems:  No other skin or systemic complaints except as noted in HPI or Assessment and Plan.  Objective  Well appearing patient in no apparent distress; mood and affect are within normal limits.  A full examination was performed including scalp, head, eyes, ears, nose, lips, neck, chest, axillae, abdomen, back, buttocks, bilateral upper extremities, bilateral lower extremities, hands, feet, fingers, toes, fingernails, and toenails. All findings within normal limits unless otherwise noted below.   Relevant physical exam findings are noted in the Assessment and Plan.  left dorsum hand x 1 Erythematous thin papules/macules with gritty scale.  right low back x 21 (21) Erythematous stuck-on, waxy papule or plaque  Assessment & Plan   SKIN CANCER SCREENING PERFORMED TODAY.  ACTINIC DAMAGE - Chronic condition, secondary to cumulative UV/sun exposure - diffuse scaly erythematous macules with underlying dyspigmentation - Recommend daily broad spectrum sunscreen SPF 30+ to sun-exposed areas, reapply every 2 hours as needed.  - Staying in the shade or wearing long sleeves, sun glasses (UVA+UVB protection) and wide brim hats (4-inch brim around the entire circumference of the hat) are also recommended for sun protection.  - Call for new or changing lesions.  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 Condition is symptomatic / bothersome to patient. Not to goal. Rosacea is a chronic progressive skin condition usually affecting the face of adults, causing redness and/or acne bumps. It is treatable but not curable. It sometimes affects the eyes (ocular rosacea) as well. It may respond to topical and/or systemic medication and can flare with stress, sun exposure, alcohol, exercise, topical steroids (including hydrocortisone/cortisone 10) and some foods.  Daily application of broad spectrum spf 30+ sunscreen to face is recommended to reduce flares.  Patient denies grittiness of the eyes  Treatment Plan Counseling for BBL / IPL / Laser and Coordination of Care Discussed the treatment option of Broad Band Light (BBL) /Intense Pulsed Light (IPL)/ Laser for skin discoloration, including brown spots and redness.  Typically we recommend at least 1-3 treatment sessions about 5-8 weeks apart for best results.  Cannot have tanned skin when BBL performed, and regular use of sunscreen/photoprotection is advised after the procedure to help maintain results. The patient's condition may also require "maintenance treatments" in the future.  The fee for BBL / laser treatments is $350 per treatment session for the whole face.  A fee can be quoted for other parts of the body.  Insurance typically does not pay for BBL/laser treatments and therefore the fee is an out-of-pocket cost. Recommend prophylactic valtrex treatment. Once scheduled for procedure, will send Rx in prior to patient's appointment.  Pt declines treatment today.   MILIA Exam: tiny firm white papule at  right infraorbital  Treatment Plan: Discussed this is a type of cyst. Benign-appearing. Sometimes these will  clear Benign-appearing.  Observation.  Call clinic for new or changing lesions.  Recommend daily use of broad spectrum spf 30+ sunscreen to sun-exposed areas.  Patient declined removal.  Will observe.  MELANOCYTIC NEVI - Tan-brown and/or pink-flesh-colored symmetric macules and papules - Benign appearing on exam today - Observation - Call clinic for new or changing moles - Recommend daily use of broad spectrum spf 30+ sunscreen to sun-exposed areas.   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  Call if any new or changing lesions are noted between office visits   ACTINIC KERATOSIS left dorsum hand x 1 Actinic keratoses are precancerous spots that appear secondary to cumulative UV radiation exposure/sun exposure over time. They are chronic with expected duration over 1 year. A portion of actinic keratoses will progress to squamous cell carcinoma of the skin. It is not possible to reliably predict which spots will progress to skin cancer and so treatment is recommended to prevent development of skin cancer.  Recommend daily broad spectrum sunscreen  SPF 30+ to sun-exposed areas, reapply every 2 hours as needed.  Recommend staying in the shade or wearing long sleeves, sun glasses (UVA+UVB protection) and wide brim hats (4-inch brim around the entire circumference of the hat). Call for new or changing lesions. Destruction of lesion - left dorsum hand x 1 Complexity: simple   Destruction method: cryotherapy   Informed consent: discussed and consent obtained   Timeout:  patient name, date of birth, surgical site, and procedure verified Lesion destroyed using liquid nitrogen: Yes   Region frozen until ice ball extended beyond lesion: Yes   Outcome: patient tolerated procedure well with no complications   Post-procedure details: wound care instructions given   INFLAMED SEBORRHEIC KERATOSIS (21) right low back x 21 (21) Symptomatic, irritating, patient would like treated. Destruction of lesion - right low back x 21 (21) Complexity: simple   Destruction method: cryotherapy   Informed consent: discussed and consent obtained   Timeout:  patient name, date of birth, surgical site, and procedure verified Lesion destroyed using liquid nitrogen: Yes   Region frozen until ice ball extended beyond lesion: Yes   Outcome: patient tolerated procedure well with no complications   Post-procedure details: wound care instructions given   ACTINIC SKIN DAMAGE   SKIN CANCER SCREENING   HISTORY OF MALIGNANT MELANOMA   HISTORY OF BASAL CELL CARCINOMA   HISTORY OF DYSPLASTIC NEVUS   LENTIGO   MELANOCYTIC NEVUS, UNSPECIFIED LOCATION   ROSACEA   Return in about 6 months (around 03/14/2024)  for TBSE.  IAsher Muir, CMA, am acting as scribe for Armida Sans, MD.   Documentation: I have reviewed the above documentation for accuracy and completeness, and I agree with the above.  Armida Sans, MD

## 2023-10-24 ENCOUNTER — Other Ambulatory Visit: Payer: Self-pay | Admitting: Urology

## 2023-10-24 DIAGNOSIS — C61 Malignant neoplasm of prostate: Secondary | ICD-10-CM

## 2023-10-27 ENCOUNTER — Encounter: Payer: Self-pay | Admitting: Urology

## 2023-12-05 ENCOUNTER — Ambulatory Visit
Admission: RE | Admit: 2023-12-05 | Discharge: 2023-12-05 | Disposition: A | Discharge status: Home / Self Care | Source: Ambulatory Visit | Attending: Urology | Admitting: Urology

## 2023-12-05 DIAGNOSIS — C61 Malignant neoplasm of prostate: Secondary | ICD-10-CM

## 2023-12-05 MED ORDER — GADOPICLENOL 0.5 MMOL/ML IV SOLN
10.0000 mL | Freq: Once | INTRAVENOUS | Status: AC | PRN
  Administered 2023-12-05: 10 mL via INTRAVENOUS

## 2024-02-29 DIAGNOSIS — R7309 Other abnormal glucose: Secondary | ICD-10-CM | POA: Insufficient documentation

## 2024-03-05 ENCOUNTER — Ambulatory Visit: Admitting: Dermatology

## 2024-03-11 ENCOUNTER — Encounter: Payer: Self-pay | Admitting: Dermatology

## 2024-03-11 ENCOUNTER — Ambulatory Visit: Admitting: Dermatology

## 2024-03-11 DIAGNOSIS — Z86006 Personal history of melanoma in-situ: Secondary | ICD-10-CM

## 2024-03-11 DIAGNOSIS — L578 Other skin changes due to chronic exposure to nonionizing radiation: Secondary | ICD-10-CM

## 2024-03-11 DIAGNOSIS — L820 Inflamed seborrheic keratosis: Secondary | ICD-10-CM

## 2024-03-11 DIAGNOSIS — L570 Actinic keratosis: Secondary | ICD-10-CM

## 2024-03-11 DIAGNOSIS — Z85828 Personal history of other malignant neoplasm of skin: Secondary | ICD-10-CM

## 2024-03-11 DIAGNOSIS — W908XXA Exposure to other nonionizing radiation, initial encounter: Secondary | ICD-10-CM | POA: Diagnosis not present

## 2024-03-11 DIAGNOSIS — D1801 Hemangioma of skin and subcutaneous tissue: Secondary | ICD-10-CM | POA: Diagnosis not present

## 2024-03-11 DIAGNOSIS — L821 Other seborrheic keratosis: Secondary | ICD-10-CM

## 2024-03-11 DIAGNOSIS — D485 Neoplasm of uncertain behavior of skin: Secondary | ICD-10-CM

## 2024-03-11 DIAGNOSIS — D229 Melanocytic nevi, unspecified: Secondary | ICD-10-CM

## 2024-03-11 DIAGNOSIS — C44619 Basal cell carcinoma of skin of left upper limb, including shoulder: Secondary | ICD-10-CM | POA: Diagnosis not present

## 2024-03-11 DIAGNOSIS — L814 Other melanin hyperpigmentation: Secondary | ICD-10-CM

## 2024-03-11 DIAGNOSIS — Z86018 Personal history of other benign neoplasm: Secondary | ICD-10-CM

## 2024-03-11 NOTE — Patient Instructions (Addendum)
 Wound Care Instructions  Cleanse wound gently with soap and water once a day then pat dry with clean gauze. Apply a thin coat of Petrolatum (petroleum jelly, Vaseline) over the wound (unless you have an allergy to this). We recommend that you use a new, sterile tube of Vaseline. Do not pick or remove scabs. Do not remove the yellow or white healing tissue from the base of the wound.  Cover the wound with fresh, clean, nonstick gauze and secure with paper tape. You may use Band-Aids in place of gauze and tape if the wound is small enough, but would recommend trimming much of the tape off as there is often too much. Sometimes Band-Aids can irritate the skin.  You should call the office for your biopsy report after 1 week if you have not already been contacted.  If you experience any problems, such as abnormal amounts of bleeding, swelling, significant bruising, significant pain, or evidence of infection, please call the office immediately.  FOR ADULT SURGERY PATIENTS: If you need something for pain relief you may take 1 extra strength Tylenol  (acetaminophen ) AND 2 Ibuprofen (200mg  each) together every 4 hours as needed for pain. (do not take these if you are allergic to them or if you have a reason you should not take them.) Typically, you may only need pain medication for 1 to 3 days.     Melanoma ABCDEs  Melanoma is the most dangerous type of skin cancer, and is the leading cause of death from skin disease.  You are more likely to develop melanoma if you: Have light-colored skin, light-colored eyes, or red or blond hair Spend a lot of time in the sun Tan regularly, either outdoors or in a tanning bed Have had blistering sunburns, especially during childhood Have a close family member who has had a melanoma Have atypical moles or large birthmarks  Early detection of melanoma is key since treatment is typically straightforward and cure rates are extremely high if we catch it early.   The  first sign of melanoma is often a change in a mole or a new dark spot.  The ABCDE system is a way of remembering the signs of melanoma.  A for asymmetry:  The two halves do not match. B for border:  The edges of the growth are irregular. C for color:  A mixture of colors are present instead of an even brown color. D for diameter:  Melanomas are usually (but not always) greater than 6mm - the size of a pencil eraser. E for evolution:  The spot keeps changing in size, shape, and color.  Please check your skin once per month between visits. You can use a small mirror in front and a large mirror behind you to keep an eye on the back side or your body.   If you see any new or changing lesions before your next follow-up, please call to schedule a visit.  Please continue daily skin protection including broad spectrum sunscreen SPF 30+ to sun-exposed areas, reapplying every 2 hours as needed when you're outdoors.    Due to recent changes in healthcare laws, you may see results of your pathology and/or laboratory studies on MyChart before the doctors have had a chance to review them. We understand that in some cases there may be results that are confusing or concerning to you. Please understand that not all results are received at the same time and often the doctors may need to interpret multiple results in order to  provide you with the best plan of care or course of treatment. Therefore, we ask that you please give us  2 business days to thoroughly review all your results before contacting the office for clarification. Should we see a critical lab result, you will be contacted sooner.   If You Need Anything After Your Visit  If you have any questions or concerns for your doctor, please call our main line at [phone] and press option 4 to reach your doctor's medical assistant. If no one answers, please leave a voicemail as directed and we will return your call as soon as possible. Messages left after 4  pm will be answered the following business day.   You may also send us  a message via MyChart. We typically respond to MyChart messages within 1-2 business days.  For prescription refills, please ask your pharmacy to contact our office. Our fax number is [phone].  If you have an urgent issue when the clinic is closed that cannot wait until the next business day, you can page your doctor at the number below.    Please note that while we do our best to be available for urgent issues outside of office hours, we are not available 24/7.   If you have an urgent issue and are unable to reach us , you may choose to seek medical care at your doctor's office, retail clinic, urgent care center, or emergency room.  If you have a medical emergency, please immediately call 911 or go to the emergency department.  Pager Numbers  - Dr. Hester: [phone]  - Dr. Jackquline: [phone]  - Dr. Claudene: [phone]   - Dr. Raymund: [phone]  In the event of inclement weather, please call our main line at [phone] for an update on the status of any delays or closures.  Dermatology Medication Tips: Please keep the boxes that topical medications come in in order to help keep track of the instructions about where and how to use these. Pharmacies typically print the medication instructions only on the boxes and not directly on the medication tubes.   If your medication is too expensive, please contact our office at [phone] option 4 or send us  a message through MyChart.   We are unable to tell what your co-pay for medications will be in advance as this is different depending on your insurance coverage. However, we may be able to find a substitute medication at lower cost or fill out paperwork to get insurance to cover a needed medication.   If a prior authorization is required to get your medication covered by your insurance company, please allow us  1-2 business days to complete this  process.  Drug prices often vary depending on where the prescription is filled and some pharmacies may offer cheaper prices.  The website www.goodrx.com contains coupons for medications through different pharmacies. The prices here do not account for what the cost may be with help from insurance (it may be cheaper with your insurance), but the website can give you the price if you did not use any insurance.  - You can print the associated coupon and take it with your prescription to the pharmacy.  - You may also stop by our office during regular business hours and pick up a GoodRx coupon card.  - If you need your prescription sent electronically to a different pharmacy, notify our office through [facility] or by phone at [phone] option 4.     Si Usted Necesita Algo Despus de Su Visita  Tambin puede enviarnos un mensaje a travs de MyChart. Por lo general respondemos a los mensajes de MyChart en el transcurso de 1 a 2 das hbiles.  Para renovar recetas, por favor pida a su farmacia que se ponga en contacto con nuestra oficina. Randi lakes de fax es [place] [phone].  Si tiene un asunto urgente cuando la clnica est cerrada y que no puede esperar hasta el siguiente da hbil, puede llamar/localizar a su doctor(a) al nmero que aparece a continuacin.   Por favor, tenga en cuenta que aunque hacemos todo lo posible para estar disponibles para asuntos urgentes fuera del horario de [place], no estamos disponibles las 24 horas del da, los 7 [address]  [address] de la [place].   Si tiene un problema urgente y no puede comunicarse con nosotros, puede optar por buscar atencin mdica  en el consultorio de su doctor(a), en una clnica privada, en un centro de atencin urgente o en una sala de emergencias.  Si tiene Engineer, drilling, por favor llame inmediatamente al 911 o vaya a la sala de emergencias.  Nmeros de bper  - Dr. Hester: [phone]  - Dra. Jackquline: [phone]  - Dr.  Claudene: [phone]  - Dra. Kitts: [phone]  En caso de inclemencias del [place], por favor llame a nuestra lnea principal al [phone] para una actualizacin sobre el estado de cualquier retraso o cierre.  Consejos para la medicacin en dermatologa: Por favor, guarde las cajas en las que vienen los medicamentos de uso tpico para ayudarle a seguir las instrucciones sobre dnde y cmo usarlos. Las farmacias generalmente imprimen las instrucciones del medicamento slo en las cajas y no directamente en los tubos del [place].   Si su medicamento es muy caro, por favor, pngase en contacto con landry rieger llamando al [phone] y presione la opcin 4 o envenos un mensaje a travs de Clinical cytogeneticist.   No podemos decirle cul ser su copago por los medicamentos por adelantado ya que esto es diferente dependiendo de la cobertura de su seguro. Sin embargo, es posible que podamos encontrar un medicamento sustituto a Audiological scientist un formulario para que el seguro cubra el medicamento que se considera necesario.   Si se requiere una autorizacin previa para que su compaa de seguros malta su medicamento, por favor permtanos de 1 a 2 das hbiles para completar este proceso.  Los precios de los medicamentos varan con frecuencia dependiendo del Environmental consultant de dnde se surte la receta y alguna farmacias pueden ofrecer precios ms baratos.  El sitio web www.goodrx.com tiene cupones para medicamentos de Health and safety inspector. Los precios aqu no tienen en cuenta lo que podra costar con la ayuda del seguro (puede ser ms barato con su seguro), pero el sitio web puede darle el precio si no utiliz Tourist information centre manager.  - Puede imprimir el cupn correspondiente y llevarlo con su receta a la farmacia.  - Tambin puede pasar por nuestra oficina durante el horario de atencin regular y Education officer, museum una tarjeta de cupones de GoodRx.  - Si necesita que su receta se enve electrnicamente a una farmacia diferente,  informe a nuestra oficina a travs de MyChart de [place] o por telfono llamando al [phone] y presione la opcin 4.

## 2024-03-11 NOTE — Progress Notes (Signed)
 Follow-Up Visit   Subjective  James Coleman is a 68 y.o. male who presents for the following: Skin Cancer Screening and Full Body Skin Exam  The patient presents for Total-Body Skin Exam (TBSE) for skin cancer screening and mole check. The patient has spots, moles and lesions to be evaluated, some may be new or changing and the patient may have concern these could be cancer.  Hx MMis, BCC, DN.   The following portions of the chart were reviewed this encounter and updated as appropriate: medications, allergies, medical history  Review of Systems:  No other skin or systemic complaints except as noted in HPI or Assessment and Plan.  Objective  Well appearing patient in no apparent distress; mood and affect are within normal limits.  A full examination was performed including scalp, head, eyes, ears, nose, lips, neck, chest, axillae, abdomen, back, buttocks, bilateral upper extremities, bilateral lower extremities, hands, feet, fingers, toes, fingernails, and toenails. All findings within normal limits unless otherwise noted below.   Relevant physical exam findings are noted in the Assessment and Plan.  Left Forearm 0.4 cm pink papule with telangiectasias  Right Lower Medial Leg 1.2 cm tan to brown irregular patch  L medial forearm x 1, L extensor forearm x 1, R lower leg x 1, R scapula back x 6 (9) Erythematous stuck-on, waxy papule or plaque glabella x 1 Erythematous thin papules/macules with gritty scale.   Assessment & Plan   SKIN CANCER SCREENING PERFORMED TODAY.  ACTINIC DAMAGE - Chronic condition, secondary to cumulative UV/sun exposure - diffuse scaly erythematous macules with underlying dyspigmentation - Recommend daily broad spectrum sunscreen SPF 30+ to sun-exposed areas, reapply every 2 hours as needed.  - Staying in the shade or wearing long sleeves, sun glasses (UVA+UVB protection) and wide brim hats (4-inch brim around the entire circumference of the hat) are  also recommended for sun protection.  - Call for new or changing lesions.  LENTIGINES, SEBORRHEIC KERATOSES, HEMANGIOMAS - Benign normal skin lesions - Benign-appearing - Call for any changes  MELANOCYTIC NEVI - Tan-brown and/or pink-flesh-colored symmetric macules and papules - Benign appearing on exam today - Observation - Call clinic for new or changing moles - Recommend daily use of broad spectrum spf 30+ sunscreen to sun-exposed areas.   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  - Call if any new or changing lesions are noted between office visits    HISTORY OF BASAL CELL CARCINOMA OF THE SKIN - Mohs R medial canthus 07/10/2014, Mohs Chin  - No evidence of recurrence today - Recommend regular full body skin exams - Recommend daily broad spectrum sunscreen SPF 30+ to sun-exposed areas, reapply every 2 hours as needed.  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  Call if any new or changing lesions are noted between office visits     NEOPLASM OF UNCERTAIN BEHAVIOR OF SKIN (2) Left Forearm Skin / nail biopsy Type of biopsy: tangential   Informed consent: discussed and consent obtained   Timeout: patient  name, date of birth, surgical site, and procedure verified   Procedure prep:  Patient was prepped and draped in usual sterile fashion Prep type:  Isopropyl alcohol Anesthesia: the lesion was anesthetized in a standard fashion   Anesthetic:  1% lidocaine  w/ epinephrine 1-100,000 buffered w/ 8.4%  NaHCO3 Instrument used: DermaBlade   Hemostasis achieved with: pressure and aluminum chloride   Outcome: patient tolerated procedure well   Post-procedure details: sterile dressing applied and wound care instructions given   Dressing type: bandage and petrolatum    Specimen 1 - Surgical pathology Differential Diagnosis: BCC vs Nevus  Check Margins: No 2 pieces Right Lower Medial Leg Skin / nail biopsy Type of biopsy: tangential   Informed consent: discussed and consent obtained   Timeout: patient name, date of birth, surgical site, and procedure verified   Procedure prep:  Patient was prepped and draped in usual sterile fashion Prep type:  Isopropyl alcohol Anesthesia: the lesion was anesthetized in a standard fashion   Anesthetic:  1% lidocaine  w/ epinephrine 1-100,000 buffered w/ 8.4% NaHCO3 Instrument used: DermaBlade   Hemostasis achieved with: pressure and aluminum chloride   Outcome: patient tolerated procedure well   Post-procedure details: sterile dressing applied and wound care instructions given   Dressing type: bandage and petrolatum    Specimen 2 - Surgical pathology Differential Diagnosis: SK vs Lentigo vs Lentigo Maligna  Check Margins: No INFLAMED SEBORRHEIC KERATOSIS (9) L medial forearm x 1, L extensor forearm x 1, R lower leg x 1, R scapula back x 6 (9) Symptomatic, irritating, patient would like treated.  Benign-appearing.  Call clinic for new or changing lesions.   Destruction of lesion - L medial forearm x 1, L extensor forearm x 1, R lower leg x 1, R scapula back x 6 (9) Complexity: simple   Destruction method: cryotherapy   Informed consent: discussed and consent obtained   Timeout:  patient name, date of birth, surgical site, and procedure verified Lesion destroyed using liquid nitrogen: Yes   Region frozen until ice ball extended beyond lesion: Yes   Cryo cycles: 1 or 2. Outcome: patient tolerated procedure well with no complications    Post-procedure details: wound care instructions given    AK (ACTINIC KERATOSIS) glabella x 1 Actinic keratoses are precancerous spots that appear secondary to cumulative UV radiation exposure/sun exposure over time. They are chronic with expected duration over 1 year. A portion of actinic keratoses will progress to squamous cell carcinoma of the skin. It is not possible to reliably predict which spots will progress to skin cancer and so treatment is recommended to prevent development of skin cancer.  Recommend daily broad spectrum sunscreen SPF 30+ to sun-exposed areas, reapply every 2 hours as needed.  Recommend staying in the shade or wearing long sleeves, sun glasses (UVA+UVB protection) and wide brim hats (4-inch brim around the entire circumference of the hat). Call for new or changing lesions. Destruction of lesion - glabella x 1 Complexity: simple   Destruction method: cryotherapy   Informed consent: discussed and consent obtained   Timeout:  patient name, date of birth, surgical site, and procedure verified Lesion destroyed using liquid nitrogen: Yes   Region frozen until ice ball extended beyond lesion: Yes   Cryo cycles: 1 or 2. Outcome: patient tolerated procedure well with no complications   Post-procedure details: wound care instructions given    MULTIPLE BENIGN NEVI   LENTIGINES   ACTINIC ELASTOSIS   SEBORRHEIC KERATOSES   CHERRY ANGIOMA  Return in about 6 months (around 09/08/2024) for TBSE, with Dr. Claudene, [facility], HxMMis, HxDN.  LILLETTE Lonell Drones, RMA, am acting as scribe for Boneta Claudene, MD .   Documentation: I have reviewed the above documentation for accuracy and completeness, and I agree with the above.  Boneta Claudene, MD

## 2024-03-13 DIAGNOSIS — C4491 Basal cell carcinoma of skin, unspecified: Secondary | ICD-10-CM

## 2024-03-13 HISTORY — DX: Basal cell carcinoma of skin, unspecified: C44.91

## 2024-03-13 LAB — SURGICAL PATHOLOGY

## 2024-03-14 ENCOUNTER — Ambulatory Visit: Payer: Self-pay | Admitting: Dermatology

## 2024-03-18 ENCOUNTER — Encounter: Payer: Self-pay | Admitting: Dermatology

## 2024-03-18 NOTE — Telephone Encounter (Signed)
 Patient advised pathology results. Patient prefers excision to treat BCC at left forearm. Offered surgery appt 05/15/24 but pt asked to go to the beginning of the new year. Patient scheduled for excision 06/26/24 at 8:30 am. Lonell RAMAN., RMA

## 2024-03-18 NOTE — Telephone Encounter (Signed)
-----   Message from [facility] sent at 03/14/2024  5:56 PM EDT ----- Diagnosis 1. Skin, left forearm :       BASAL CELL CARCINOMA, NODULAR PATTERN        2. Skin, right lower medial leg :       PIGMENTED SEBORRHEIC KERATOSIS    Please call to share diagnosis and discuss treatment options.   L forearm Explanation: your biopsy shows basal cell skin cancer in the second layer of the skin. This is the most common kind of skin cancer and is caused by damage from sun exposure. Basal cell skin cancers  almost never spread beyond the skin, so they are not dangerous to your overall health. However, they will continue to grow, can bleed, cause nonhealing wounds, and disrupt nearby structures unless  fully treated.   Treatment option 1: you return for a brief appointment where I perform electrodesiccation and curettage [facility]). This involves three rounds of scraping and burning to destroy the skin cancer. It has  about an 85% cure rate and leaves a round wound slightly larger than the skin cancer and leaves a round white scar. No additional pathology is done. If the skin cancer comes back, we would need to do  a surgery to remove it.   Treatment option 2: Excision - you return for an hour long appointment in our clinic where we perform a skin surgery. We numb the site of the skin cancer and a safety margin of normal skin around it.  We remove the full thickness of skin and close the wound with two layers of stitches. The sample is sent to the lab to check that the skin cancer was fully removed. Return one week later to have  wound checked and surface stitches removed. Surgical wound leaves a line scar. Approximately 95% cure rate. Risk of recurrence, bleeding, infection, pain, injury to nearby structures, hypertrophic  scar.   2. R lower leg Biopsy shows a benign thickening of the top layer of skin. No treatment needed ----- Message ----- From: Interface, Lab In [facility] Sent:  03/13/2024   5:26 PM EDT To: Boneta Sharps, MD

## 2024-06-04 ENCOUNTER — Ambulatory Visit: Admitting: Podiatry

## 2024-06-04 DIAGNOSIS — L600 Ingrowing nail: Secondary | ICD-10-CM

## 2024-06-04 NOTE — Progress Notes (Signed)
 Subjective:  Patient ID: James Coleman, male    DOB: [birth year],  MRN: [id number]  Chief Complaint  Patient presents with   Nail Problem    Bilateral ingrown     68 y.o. male presents with the above complaint.  Patient presents with bilateral hallux medial border ingrown painful to touch is progressing and worse worse with ambulation or shoe pressure he would like to have it removed he has not seen MRIs prior to seeing me denies any other acute complaints.  History of previous ingrown removed in the past but not the same side.   Review of Systems: Negative except as noted in the HPI. Denies N/V/F/Ch.  Past Medical History:  Diagnosis Date   BCC (basal cell carcinoma) 03/13/2024   left forearm, excision scheduled 06/26/24   High cholesterol    History of basal cell carcinoma 07/10/2014   R medial canthus   History of basal cell carcinoma    chin   History of dysplastic nevus 01/27/2016   left mid back   History of melanoma in situ 06/22/2011   right mid back   Hypertension    Current Medications[1]  Tobacco Use History[2]  Allergies[3] Objective:  There were no vitals filed for this visit. There is no height or weight on file to calculate BMI. Constitutional Well developed. Well nourished.  Vascular Dorsalis pedis pulses palpable bilaterally. Posterior tibial pulses palpable bilaterally. Capillary refill normal to all digits.  No cyanosis or clubbing noted. Pedal hair growth normal.  Neurologic Normal speech. Oriented to person, place, and time. Epicritic sensation to light touch grossly present bilaterally.  Dermatologic Painful ingrowing nail at medial nail borders of the hallux nail bilaterally. No other open wounds. No skin lesions.  Orthopedic: Normal joint ROM without pain or crepitus bilaterally. No visible deformities. No bony tenderness.   Radiographs: None Assessment:   1. Ingrown toenail of right foot   2. Ingrown left big toenail    Plan:   Patient was evaluated and treated and all questions answered.  Ingrown Nail, bilaterally -Patient elects to proceed with minor surgery to remove ingrown toenail removal today. Consent reviewed and signed by patient. -Ingrown nail excised. See procedure note. -Educated on post-procedure care including soaking. Written instructions provided and reviewed. -Patient to follow up in 2 weeks for nail check.  Procedure: Excision of Ingrown Toenail Location: Bilateral 1st toe medial nail borders. Anesthesia: Lidocaine  1% plain; 1.5 mL and Marcaine 0.5% plain; 1.5 mL, digital block. Skin Prep: Betadine. Dressing: Silvadene; telfa; dry, sterile, compression dressing. Technique: Following skin prep, the toe was exsanguinated and a tourniquet was secured at the base of the toe. The affected nail border was freed, split with a nail splitter, and excised. Chemical matrixectomy was then performed with phenol and irrigated out with alcohol. The tourniquet was then removed and sterile dressing applied. Disposition: Patient tolerated procedure well. Patient to return in 2 weeks for follow-up.   No follow-ups on file.    [1]  Current Outpatient Medications:    acetaminophen  (TYLENOL ) 500 MG tablet, Take 500 mg by mouth every 6 (six) hours as needed., Disp: , Rfl:    busPIRone (BUSPAR) 15 MG tablet, Take 15 mg by mouth 3 (three) times daily., Disp: , Rfl:    cephALEXin  (KEFLEX ) 500 MG capsule, Take 1 capsule (500 mg total) by mouth 3 (three) times daily., Disp: 21 capsule, Rfl: 0   cetirizine (ZYRTEC) 10 MG tablet, Take 10 mg by mouth daily., Disp: , Rfl:  clonazePAM (KLONOPIN) 0.5 MG tablet, Take 0.5 mg by mouth 2 (two) times daily as needed for anxiety., Disp: , Rfl:    Cyanocobalamin (VITAMIN B 12 PO), Take by mouth., Disp: , Rfl:    cyclobenzaprine (FLEXERIL) 5 MG tablet, Take 5 mg by mouth 3 (three) times daily as needed for muscle spasms., Disp: , Rfl:    HYDROcodone -acetaminophen  (NORCO) 5-325 MG  tablet, Take 1 tablet by mouth every 6 (six) hours as needed for moderate pain., Disp: 10 tablet, Rfl: 0   losartan-hydrochlorothiazide (HYZAAR) 100-25 MG tablet, Take 1 tablet by mouth daily., Disp: , Rfl:    meloxicam (MOBIC) 15 MG tablet, Take 15 mg by mouth daily., Disp: , Rfl:    metoprolol succinate (TOPROL-XL) 25 MG 24 hr tablet, Take 25 mg by mouth daily., Disp: , Rfl:    sildenafil (VIAGRA) 100 MG tablet, Take 100 mg by mouth daily as needed for erectile dysfunction., Disp: , Rfl:    simvastatin (ZOCOR) 20 MG tablet, Take 20 mg by mouth daily., Disp: , Rfl:    zolpidem (AMBIEN) 10 MG tablet, Take 10 mg by mouth at bedtime as needed for sleep., Disp: , Rfl:  [2]  Social History Tobacco Use  Smoking Status Former   Current packs/day: 0.00   Types: Cigarettes   Quit date: 1982   Years since quitting: 43.9  Smokeless Tobacco Never  [3]  Allergies Allergen Reactions   Codeine Other (See Comments)    Makes patient feel queasy

## 2024-06-04 NOTE — Patient Instructions (Signed)

## 2024-06-26 ENCOUNTER — Ambulatory Visit: Admitting: Dermatology

## 2024-06-26 ENCOUNTER — Encounter: Payer: Self-pay | Admitting: Dermatology

## 2024-06-26 DIAGNOSIS — C44619 Basal cell carcinoma of skin of left upper limb, including shoulder: Secondary | ICD-10-CM | POA: Diagnosis not present

## 2024-06-26 MED ORDER — MUPIROCIN 2 % EX OINT: 1.0000 | TOPICAL_OINTMENT | Freq: Every day | CUTANEOUS | 0 refills | Status: AC

## 2024-06-26 NOTE — Progress Notes (Signed)
" ° °  Follow-Up Visit   Subjective  James Coleman is a 69 y.o. male who presents for the following: Excision of BCC at left forearm  The following portions of the chart were reviewed this encounter and updated as appropriate: medications, allergies, medical history  Review of Systems:  No other skin or systemic complaints except as noted in HPI or Assessment and Plan.  Objective  Well appearing patient in no apparent distress; mood and affect are within normal limits.  A focused examination was performed of the following areas: Left arm Relevant physical exam findings are noted in the Assessment and Plan.   Left Forearm Pink bx site  Assessment & Plan   BASAL CELL CARCINOMA (BCC) OF SKIN OF LEFT UPPER EXTREMITY INCLUDING SHOULDER Left Forearm - Skin excision  Excision method:  elliptical Lesion length (cm):  0.6 Margin per side (cm):  0.4 Total excision diameter (cm):  1.4 Informed consent: discussed and consent obtained   Timeout: patient name, date of birth, surgical site, and procedure verified   Procedure prep:  Patient was prepped and draped in usual sterile fashion Prep type:  Chlorhexidine Anesthesia: the lesion was anesthetized in a standard fashion   Anesthetic:  1% lidocaine  w/ epinephrine 1-100,000 buffered w/ 8.4% NaHCO3 (18 cc) Instrument used: #15 blade   Hemostasis achieved with: suture, pressure and electrodesiccation   Outcome: patient tolerated procedure well with no complications    - Skin repair Complexity:  Intermediate Final length (cm):  4.3 Informed consent: discussed and consent obtained   Timeout: patient name, date of birth, surgical site, and procedure verified   Procedure prep:  Patient was prepped and draped in usual sterile fashion Prep type:  Chlorhexidine Anesthesia: the lesion was anesthetized in a standard fashion   Anesthetic:  1% lidocaine  w/ epinephrine 1-100,000 buffered w/ 8.4% NaHCO3 Reason for type of repair: reduce tension to  allow closure, reduce the risk of dehiscence, infection, and necrosis, reduce subcutaneous dead space and avoid a hematoma, allow closure of the large defect and preserve normal anatomy   Undermining: edges could be approximated without difficulty   Subcutaneous layers (deep stitches):  Suture size:  4-0 Suture type: Vicryl (polyglactin 910)   Stitches:  Buried vertical mattress Fine/surface layer approximation (top stitches):  Suture size:  5-0 Suture type: Prolene (polypropylene)   Stitches comment:  Running locked Suture removal (days):  13 Hemostasis achieved with: suture, pressure and electrodesiccation Outcome: patient tolerated procedure well with no complications   Post-procedure details: sterile dressing applied and wound care instructions given   Dressing type: petrolatum, bandage and pressure dressing    Specimen 1 - Surgical pathology Differential Diagnosis: BX proven BCC  Check Margins: yes [id number] Proximal tag   Return in about 2 weeks (around 07/10/2024) for suture removal.  LILLETTE Lonell Drones, RMA, am acting as scribe for Boneta Sharps, MD .   Documentation: I have reviewed the above documentation for accuracy and completeness, and I agree with the above.  Boneta Sharps, MD  "

## 2024-06-26 NOTE — Patient Instructions (Signed)

## 2024-06-27 ENCOUNTER — Telehealth: Payer: Self-pay

## 2024-06-27 LAB — SURGICAL PATHOLOGY

## 2024-06-27 NOTE — Telephone Encounter (Signed)
 Patient doing fine after yesterday's surgery, no questions or concerns. Lonell RAMAN., RMA

## 2024-06-30 ENCOUNTER — Ambulatory Visit: Payer: Self-pay | Admitting: Dermatology

## 2024-07-01 NOTE — Telephone Encounter (Signed)
-----   Message from Boneta Sharps, MD sent at 06/30/2024  7:09 PM EST ----- Diagnosis left forearm :       NO RESIDUAL BASAL CELL CARCINOMA, MARGINS FREE   Please call to share that excision was clear of BCC and get update on surgical wound. Thank you.

## 2024-07-01 NOTE — Telephone Encounter (Signed)
 LVM advising pathology showed margins free, no residual BCC. Lonell RAMAN., RMA

## 2024-07-09 ENCOUNTER — Encounter: Payer: Self-pay | Admitting: Dermatology

## 2024-07-09 ENCOUNTER — Ambulatory Visit: Admitting: Dermatology

## 2024-07-09 DIAGNOSIS — Z48817 Encounter for surgical aftercare following surgery on the skin and subcutaneous tissue: Secondary | ICD-10-CM

## 2024-07-09 DIAGNOSIS — Z4802 Encounter for removal of sutures: Secondary | ICD-10-CM

## 2024-07-09 DIAGNOSIS — Z5189 Encounter for other specified aftercare: Secondary | ICD-10-CM

## 2024-07-09 NOTE — Patient Instructions (Signed)

## 2024-07-09 NOTE — Progress Notes (Signed)
" ° °  Follow-Up Visit   Subjective  James Coleman is a 69 y.o. male who presents for the following: Suture removal  Pathology showed NO RESIDUAL BASAL CELL CARCINOMA, MARGINS FREE   The following portions of the chart were reviewed this encounter and updated as appropriate: medications, allergies, medical history  Review of Systems:  No other skin or systemic complaints except as noted in HPI or Assessment and Plan.  Objective  Well appearing patient in no apparent distress; mood and affect are within normal limits.  Areas Examined: Left forearm Relevant physical exam findings are noted in the Assessment and Plan.        Assessment & Plan   VISIT FOR WOUND CHECK   ENCOUNTER FOR REMOVAL OF SUTURES   Encounter for Removal of Sutures - Incision site is clean, dry and intact. - Wound cleansed, sutures removed - Discussed pathology results showing NO RESIDUAL BASAL CELL CARCINOMA, MARGINS FREE  - Scars remodel for a full year. - Patient can apply over-the-counter silicone scar cream once to twice a day to help with scar remodeling if desired. - Patient advised to call with any concerns or if they notice any new or changing lesions.  Return for TBSE, as scheduled, with Dr. Claudene, [facility], HxDN, HxMMis.  LILLETTE Lonell Drones, RMA, am acting as scribe for Boneta Claudene, MD .   Documentation: I have reviewed the above documentation for accuracy and completeness, and I agree with the above.  Boneta Claudene, MD  "

## 2024-09-09 ENCOUNTER — Ambulatory Visit: Admitting: Dermatology
